# Patient Record
Sex: Male | Born: 1935 | Race: White | Hispanic: No | Smoking: Current every day smoker
Health system: Southern US, Community
[De-identification: ages and names within clinical notes are randomized; demographics above are authoritative.]

## PROBLEM LIST (undated history)

## (undated) DIAGNOSIS — L909 Atrophic disorder of skin, unspecified: Secondary | ICD-10-CM

## (undated) DIAGNOSIS — E119 Type 2 diabetes mellitus without complications: Secondary | ICD-10-CM

## (undated) DIAGNOSIS — I10 Essential (primary) hypertension: Secondary | ICD-10-CM

## (undated) DIAGNOSIS — I8310 Varicose veins of unspecified lower extremity with inflammation: Secondary | ICD-10-CM

## (undated) DIAGNOSIS — R300 Dysuria: Secondary | ICD-10-CM

## (undated) DIAGNOSIS — R634 Abnormal weight loss: Secondary | ICD-10-CM

## (undated) DIAGNOSIS — N281 Cyst of kidney, acquired: Secondary | ICD-10-CM

## (undated) DIAGNOSIS — H9190 Unspecified hearing loss, unspecified ear: Secondary | ICD-10-CM

## (undated) DIAGNOSIS — R609 Edema, unspecified: Secondary | ICD-10-CM

## (undated) DIAGNOSIS — E785 Hyperlipidemia, unspecified: Secondary | ICD-10-CM

## (undated) DIAGNOSIS — F17200 Nicotine dependence, unspecified, uncomplicated: Secondary | ICD-10-CM

## (undated) DIAGNOSIS — N529 Male erectile dysfunction, unspecified: Secondary | ICD-10-CM

## (undated) DIAGNOSIS — L919 Hypertrophic disorder of the skin, unspecified: Secondary | ICD-10-CM

## (undated) DIAGNOSIS — I4949 Other premature depolarization: Secondary | ICD-10-CM

## (undated) DIAGNOSIS — K4090 Unilateral inguinal hernia, without obstruction or gangrene, not specified as recurrent: Secondary | ICD-10-CM

## (undated) DIAGNOSIS — H269 Unspecified cataract: Secondary | ICD-10-CM

## (undated) DIAGNOSIS — N401 Enlarged prostate with lower urinary tract symptoms: Secondary | ICD-10-CM

## (undated) DIAGNOSIS — E559 Vitamin D deficiency, unspecified: Secondary | ICD-10-CM

## (undated) DIAGNOSIS — L821 Other seborrheic keratosis: Secondary | ICD-10-CM

## (undated) DIAGNOSIS — K5730 Diverticulosis of large intestine without perforation or abscess without bleeding: Secondary | ICD-10-CM

## (undated) HISTORY — DX: Unspecified hearing loss, unspecified ear: H91.90

## (undated) HISTORY — DX: Varicose veins of unspecified lower extremity with inflammation: I83.10

## (undated) HISTORY — DX: Abnormal weight loss: R63.4

## (undated) HISTORY — DX: Benign prostatic hyperplasia with lower urinary tract symptoms: N40.1

## (undated) HISTORY — DX: Atrophic disorder of skin, unspecified: L90.9

## (undated) HISTORY — DX: Edema, unspecified: R60.9

## (undated) HISTORY — DX: Essential (primary) hypertension: I10

## (undated) HISTORY — DX: Cyst of kidney, acquired: N28.1

## (undated) HISTORY — DX: Hypertrophic disorder of the skin, unspecified: L91.9

## (undated) HISTORY — DX: Vitamin D deficiency, unspecified: E55.9

## (undated) HISTORY — DX: Hyperlipidemia, unspecified: E78.5

## (undated) HISTORY — DX: Diverticulosis of large intestine without perforation or abscess without bleeding: K57.30

## (undated) HISTORY — DX: Male erectile dysfunction, unspecified: N52.9

## (undated) HISTORY — DX: Other seborrheic keratosis: L82.1

## (undated) HISTORY — DX: Dysuria: R30.0

## (undated) HISTORY — DX: Unilateral inguinal hernia, without obstruction or gangrene, not specified as recurrent: K40.90

## (undated) HISTORY — DX: Type 2 diabetes mellitus without complications: E11.9

## (undated) HISTORY — DX: Other premature depolarization: I49.49

## (undated) HISTORY — DX: Unspecified cataract: H26.9

## (undated) HISTORY — DX: Nicotine dependence, unspecified, uncomplicated: F17.200

---
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
## 1992-03-12 HISTORY — PX: DUPUYTREN CONTRACTURE RELEASE: SHX1478

## 1998-03-12 DIAGNOSIS — H269 Unspecified cataract: Secondary | ICD-10-CM

## 1998-03-12 HISTORY — DX: Unspecified cataract: H26.9

## 1998-03-12 HISTORY — PX: EYE SURGERY: SHX253

## 2000-08-20 ENCOUNTER — Encounter: Payer: Self-pay | Admitting: *Deleted

## 2000-08-20 ENCOUNTER — Encounter: Admission: RE | Admit: 2000-08-20 | Discharge: 2000-08-20 | Payer: Self-pay | Admitting: *Deleted

## 2000-08-21 ENCOUNTER — Ambulatory Visit (HOSPITAL_BASED_OUTPATIENT_CLINIC_OR_DEPARTMENT_OTHER): Admission: RE | Admit: 2000-08-21 | Discharge: 2000-08-21 | Payer: Self-pay | Admitting: *Deleted

## 2002-08-03 ENCOUNTER — Encounter: Admission: RE | Admit: 2002-08-03 | Discharge: 2002-08-03 | Payer: Self-pay | Admitting: Unknown Physician Specialty

## 2003-03-13 DIAGNOSIS — N281 Cyst of kidney, acquired: Secondary | ICD-10-CM

## 2003-03-13 HISTORY — DX: Cyst of kidney, acquired: N28.1

## 2003-03-13 HISTORY — PX: HERNIA REPAIR: SHX51

## 2003-03-13 HISTORY — PX: CATARACT EXTRACTION W/ INTRAOCULAR LENS IMPLANT: SHX1309

## 2003-04-07 LAB — HM COLONOSCOPY: HM Colonoscopy: NORMAL

## 2003-05-28 DIAGNOSIS — K4090 Unilateral inguinal hernia, without obstruction or gangrene, not specified as recurrent: Secondary | ICD-10-CM

## 2003-05-28 HISTORY — DX: Unilateral inguinal hernia, without obstruction or gangrene, not specified as recurrent: K40.90

## 2003-07-16 ENCOUNTER — Encounter: Admission: RE | Admit: 2003-07-16 | Discharge: 2003-07-16 | Payer: Self-pay | Admitting: Surgery

## 2003-07-19 ENCOUNTER — Ambulatory Visit (HOSPITAL_COMMUNITY): Admission: RE | Admit: 2003-07-19 | Discharge: 2003-07-19 | Payer: Self-pay | Admitting: Surgery

## 2003-07-19 ENCOUNTER — Ambulatory Visit (HOSPITAL_BASED_OUTPATIENT_CLINIC_OR_DEPARTMENT_OTHER): Admission: RE | Admit: 2003-07-19 | Discharge: 2003-07-19 | Payer: Self-pay | Admitting: Surgery

## 2005-02-16 DIAGNOSIS — N529 Male erectile dysfunction, unspecified: Secondary | ICD-10-CM

## 2005-02-16 DIAGNOSIS — I10 Essential (primary) hypertension: Secondary | ICD-10-CM

## 2005-02-16 HISTORY — DX: Essential (primary) hypertension: I10

## 2005-02-16 HISTORY — DX: Male erectile dysfunction, unspecified: N52.9

## 2005-02-28 DIAGNOSIS — I4949 Other premature depolarization: Secondary | ICD-10-CM | POA: Insufficient documentation

## 2005-02-28 DIAGNOSIS — K5730 Diverticulosis of large intestine without perforation or abscess without bleeding: Secondary | ICD-10-CM

## 2005-02-28 HISTORY — DX: Other premature depolarization: I49.49

## 2005-02-28 HISTORY — DX: Diverticulosis of large intestine without perforation or abscess without bleeding: K57.30

## 2005-12-19 DIAGNOSIS — F17200 Nicotine dependence, unspecified, uncomplicated: Secondary | ICD-10-CM

## 2005-12-19 HISTORY — DX: Nicotine dependence, unspecified, uncomplicated: F17.200

## 2006-12-04 DIAGNOSIS — E785 Hyperlipidemia, unspecified: Secondary | ICD-10-CM

## 2006-12-04 DIAGNOSIS — E119 Type 2 diabetes mellitus without complications: Secondary | ICD-10-CM

## 2006-12-04 DIAGNOSIS — R609 Edema, unspecified: Secondary | ICD-10-CM

## 2006-12-04 DIAGNOSIS — I8310 Varicose veins of unspecified lower extremity with inflammation: Secondary | ICD-10-CM

## 2006-12-04 HISTORY — DX: Hyperlipidemia, unspecified: E78.5

## 2006-12-04 HISTORY — DX: Varicose veins of unspecified lower extremity with inflammation: I83.10

## 2006-12-04 HISTORY — DX: Edema, unspecified: R60.9

## 2006-12-04 HISTORY — DX: Type 2 diabetes mellitus without complications: E11.9

## 2007-08-08 DIAGNOSIS — N401 Enlarged prostate with lower urinary tract symptoms: Secondary | ICD-10-CM

## 2007-08-08 DIAGNOSIS — L821 Other seborrheic keratosis: Secondary | ICD-10-CM

## 2007-08-08 DIAGNOSIS — N138 Other obstructive and reflux uropathy: Secondary | ICD-10-CM

## 2007-08-08 HISTORY — DX: Other obstructive and reflux uropathy: N13.8

## 2007-08-08 HISTORY — DX: Other seborrheic keratosis: L82.1

## 2007-08-08 HISTORY — DX: Benign prostatic hyperplasia with lower urinary tract symptoms: N40.1

## 2008-08-04 DIAGNOSIS — E559 Vitamin D deficiency, unspecified: Secondary | ICD-10-CM

## 2008-08-04 HISTORY — DX: Vitamin D deficiency, unspecified: E55.9

## 2012-03-28 DIAGNOSIS — R300 Dysuria: Secondary | ICD-10-CM

## 2012-03-28 HISTORY — DX: Dysuria: R30.0

## 2012-05-09 ENCOUNTER — Ambulatory Visit: Payer: Medicare Other

## 2012-05-09 ENCOUNTER — Ambulatory Visit (INDEPENDENT_AMBULATORY_CARE_PROVIDER_SITE_OTHER): Payer: Medicare Other | Admitting: Family Medicine

## 2012-05-09 VITALS — BP 136/82 | HR 56 | Temp 98.3°F | Resp 17 | Ht 67.0 in | Wt 158.0 lb

## 2012-05-09 DIAGNOSIS — I10 Essential (primary) hypertension: Secondary | ICD-10-CM

## 2012-05-09 DIAGNOSIS — M25522 Pain in left elbow: Secondary | ICD-10-CM

## 2012-05-09 DIAGNOSIS — R350 Frequency of micturition: Secondary | ICD-10-CM

## 2012-05-09 DIAGNOSIS — M25529 Pain in unspecified elbow: Secondary | ICD-10-CM

## 2012-05-09 DIAGNOSIS — T148XXA Other injury of unspecified body region, initial encounter: Secondary | ICD-10-CM

## 2012-05-09 LAB — POCT CBC
Granulocyte percent: 68.8 %G (ref 37–80)
HCT, POC: 46 % (ref 43.5–53.7)
Hemoglobin: 14.2 g/dL (ref 14.1–18.1)
Lymph, poc: 1.6 (ref 0.6–3.4)
MCH, POC: 28.9 pg (ref 27–31.2)
MCHC: 30.9 g/dL — AB (ref 31.8–35.4)
MCV: 93.4 fL (ref 80–97)
MID (cbc): 0.6 (ref 0–0.9)
MPV: 9.8 fL (ref 0–99.8)
POC Granulocyte: 4.9 (ref 2–6.9)
POC LYMPH PERCENT: 22.2 %L (ref 10–50)
POC MID %: 9 % (ref 0–12)
Platelet Count, POC: 241 10*3/uL (ref 142–424)
RBC: 4.92 M/uL (ref 4.69–6.13)
RDW, POC: 15.5 %
WBC: 7.1 10*3/uL (ref 4.6–10.2)

## 2012-05-09 LAB — COMPREHENSIVE METABOLIC PANEL
Albumin: 4.2 g/dL (ref 3.5–5.2)
Alkaline Phosphatase: 69 U/L (ref 39–117)
BUN: 27 mg/dL — ABNORMAL HIGH (ref 6–23)
CO2: 30 mEq/L (ref 19–32)
Calcium: 9.2 mg/dL (ref 8.4–10.5)
Glucose, Bld: 79 mg/dL (ref 70–99)
Potassium: 4.2 mEq/L (ref 3.5–5.3)

## 2012-05-09 LAB — POCT UA - MICROSCOPIC ONLY
Bacteria, U Microscopic: NEGATIVE
Casts, Ur, LPF, POC: NEGATIVE
Crystals, Ur, HPF, POC: NEGATIVE
Mucus, UA: NEGATIVE
RBC, urine, microscopic: NEGATIVE
WBC, Ur, HPF, POC: NEGATIVE
Yeast, UA: NEGATIVE

## 2012-05-09 LAB — COMPREHENSIVE METABOLIC PANEL WITH GFR
ALT: 9 U/L (ref 0–53)
AST: 13 U/L (ref 0–37)
Chloride: 106 meq/L (ref 96–112)
Creat: 0.92 mg/dL (ref 0.50–1.35)
Sodium: 144 meq/L (ref 135–145)
Total Bilirubin: 0.8 mg/dL (ref 0.3–1.2)
Total Protein: 6.9 g/dL (ref 6.0–8.3)

## 2012-05-09 LAB — POCT URINALYSIS DIPSTICK
Blood, UA: NEGATIVE
Glucose, UA: NEGATIVE
Leukocytes, UA: NEGATIVE
Nitrite, UA: NEGATIVE
Protein, UA: NEGATIVE
Spec Grav, UA: 1.025
Urobilinogen, UA: 4
pH, UA: 6

## 2012-05-09 NOTE — Progress Notes (Signed)
 [facility]:  Office Visit  Chief Complaint:  Chief Complaint  Patient presents with  . Arm Injury    fall     HPI: Victor Buck is a 79 y.o. male who complains of left arm pain after he  Fell at home on carpet in his den  and landed on left arm 3 days ago. He is having  pain and trouble gripping. + swelling n left forearm. He was at home, in the den, got up out of recliner while  watching TV and fell straight down. His legs gave way and landed flat on his face. He denies LOC, confusion, nose bleeds. Per his daughter she states that he has an issue with falls, but this time he stood up and just fell so that was a little different. Deneis CP, palpitations, SOB, urinary issues. Recent exam with PCP Art greene was WNL for age. He is on a baby aspirin, and 2 HTN meds.   Past Medical History  Diagnosis Date  . Hypertension    Past Surgical History  Procedure Laterality Date  . Appendectomy    . Hernia repair     History   Social History  . Marital Status: Widowed    Spouse Name: N/A    Number of Children: N/A  . Years of Education: N/A   Social History Main Topics  . Smoking status: Current Every Day Smoker -- 0.50 packs/day for 70 years    Types: Cigarettes  . Smokeless tobacco: None  . Alcohol Use: 0.6 oz/week    1 Glasses of wine per week  . Drug Use: No  . Sexually Active: No   Other Topics Concern  . None   Social History Narrative  . None   History reviewed. No pertinent family history. No Known Allergies Prior to Admission medications   Medication Sig Start Date End Date Taking? Authorizing Provider  aspirin 81 MG tablet Take 81 mg by mouth daily.   Yes Historical Provider, MD  enalapril (VASOTEC) 20 MG tablet Take 20 mg by mouth daily.   Yes Historical Provider, MD  metoprolol succinate (TOPROL-XL) 25 MG 24 hr tablet Take 25 mg by mouth daily.   Yes Historical Provider, MD     ROS: The patient denies fevers, chills, night sweats,  unintentional weight loss, chest pain, palpitations, wheezing, dyspnea on exertion, nausea, vomiting, abdominal pain, dysuria, hematuria, melena, numbness, weakness, or tingling.   All other systems have been reviewed and were otherwise negative with the exception of those mentioned in the HPI and as above.    PHYSICAL EXAM: Filed Vitals:   05/09/12 1119  BP: 136/82  Pulse: 56  Temp: 98.3 F (36.8 C)  Resp: 17   Filed Vitals:   05/09/12 1119  Height: 5\' 7"  (1.702 m)  Weight: 158 lb (71.668 kg)   Body mass index is 24.74 kg/(m^2).  General: Alert, no acute distress HEENT:  Normocephalic, atraumatic, oropharynx patent.  EOMI, PERRLA, head eam nl. Fundoscopic exam grossly nl Cardiovascular:  Regular rate and rhythm, no rubs murmurs or gallops.  No Carotid bruits, radial pulse intact. No pedal edema.  Respiratory: Clear to auscultation bilaterally.  No wheezes, rales, or rhonchi.  No cyanosis, no use of accessory musculature GI: No organomegaly, abdomen is soft and non-tender, positive bowel sounds.  No masses. Skin: No rashes. Neurologic: Facial musculature symmetric. Psychiatric: Patient is appropriate throughout our interaction. Lymphatic: No cervical lymphadenopathy Musculoskeletal: Gait intact with cane, severe kyphosis Left shoulder-nl exam  for age Left elbow-full ROM, tender Left forarm-no defomrities, + swelling, tender on mid forearm, able to pronate and supinate without pain, 5/5 strength, sensation intact Left wrist/hand-full ROM, 5/5 stregnth, sensation intact   LABS: Results for orders placed in visit on 05/09/12  POCT UA - MICROSCOPIC ONLY      Result Value Range   WBC, Ur, HPF, POC neg     RBC, urine, microscopic neg     Bacteria, U Microscopic neg     Mucus, UA neg     Epithelial cells, urine per micros 0-1     Crystals, Ur, HPF, POC neg     Casts, Ur, LPF, POC neg     Yeast, UA neg    POCT URINALYSIS DIPSTICK      Result Value Range   Color, UA amber      Clarity, UA clear     Glucose, UA neg     Bilirubin, UA small     Ketones, UA trace     Spec Grav, UA 1.025     Blood, UA neg     pH, UA 6.0     Protein, UA neg     Urobilinogen, UA 4.0     Nitrite, UA neg     Leukocytes, UA Negative    POCT CBC      Result Value Range   WBC 7.1  4.6 - 10.2 K/uL   Lymph, poc 1.6  0.6 - 3.4   POC LYMPH PERCENT 22.2  10 - 50 %L   MID (cbc) 0.6  0 - 0.9   POC MID % 9.0  0 - 12 %M   POC Granulocyte 4.9  2 - 6.9   Granulocyte percent 68.8  37 - 80 %G   RBC 4.92  4.69 - 6.13 M/uL   Hemoglobin 14.2  14.1 - 18.1 g/dL   HCT, POC 45.4  09.8 - 53.7 %   MCV 93.4  80 - 97 fL   MCH, POC 28.9  27 - 31.2 pg   MCHC 30.9 (*) 31.8 - 35.4 g/dL   RDW, POC 11.9     Platelet Count, POC 241  142 - 424 K/uL   MPV 9.8  0 - 99.8 fL     EKG/XRAY:   Primary read interpreted by [facility] Conley [facility]s at [facility]. Elbow-no fx/dislocation Forearm-no fx/dislocation, ? lucency on  Distal ulna Wrist-no fracture/dislocation    ASSESSMENT/PLAN: Encounter Diagnoses  Name Primary?  . Pain in joint, upper arm, left Yes  . Increased urinary frequency   . HTN (hypertension)    No fractures on xray Patient has msk contusions/bone contusions from fall Rx wrist splint, take otc tyleonol, motrin prn Monitor BP, pulse  Since on metoprolol and he is complaining of being dizzy, ( chronic issue), Orthostatic was normal Advise to monitor pulse since he has dipped in the mid 40s x 1 His urine showed trace ketones, encourage to increase fluid intake  F/u prn    ,  PHUONG, DO 05/09/2012 12:47 PM

## 2012-05-10 ENCOUNTER — Telehealth: Payer: Self-pay | Admitting: Family Medicine

## 2012-05-10 NOTE — Telephone Encounter (Signed)
Spoke with patient regarding labs, and also xrays

## 2012-05-13 ENCOUNTER — Telehealth: Payer: Self-pay

## 2012-05-13 NOTE — Telephone Encounter (Signed)
Patient has increased swelling of hand/fingers I have given her instructions for him to try to move his fingers/ squeeze a ball, sponge. She also states there is a 30 point difference in his blood pressure in his left arm vs his right. Please advise on swelling/ seems like it should be better by now.

## 2012-05-13 NOTE — Telephone Encounter (Signed)
Called no answer

## 2012-05-13 NOTE — Telephone Encounter (Signed)
Patient's daughter would like call back regarding her dad who's hand has become very swollen since he saw Dr Conley Rolls last week.  Best [phone]

## 2012-05-14 ENCOUNTER — Telehealth: Payer: Self-pay | Admitting: Family Medicine

## 2012-05-14 NOTE — Telephone Encounter (Signed)
Called and LM regarding BP, hand swelling.

## 2012-05-18 ENCOUNTER — Ambulatory Visit (INDEPENDENT_AMBULATORY_CARE_PROVIDER_SITE_OTHER): Payer: Medicare Other | Admitting: Internal Medicine

## 2012-05-18 ENCOUNTER — Ambulatory Visit: Payer: Medicare Other

## 2012-05-18 VITALS — BP 140/80 | HR 80 | Temp 98.5°F | Resp 16 | Ht 67.0 in | Wt 159.6 lb

## 2012-05-18 DIAGNOSIS — I10 Essential (primary) hypertension: Secondary | ICD-10-CM

## 2012-05-18 DIAGNOSIS — M25532 Pain in left wrist: Secondary | ICD-10-CM

## 2012-05-18 DIAGNOSIS — S52532A Colles' fracture of left radius, initial encounter for closed fracture: Secondary | ICD-10-CM

## 2012-05-18 NOTE — Progress Notes (Signed)
  Subjective:    Patient ID: Victor Buck, male    DOB: March 07, 1920, 79 y.o.   MRN: [id number]  HPI Pt here for a recheck of left arm from 05/09/12 OV.  His arm is still swollen and his thumb is painful. Doesn't feel like it's much better. Can't make a fist No numbness  Review of Systems     Objective:   Physical Exam Left hand and forearm is swollen Markedly tender over the distal radius Snuff box intact Lacerations over fifth MCP are healing Ecchymoses over the dorsum of the hand Mildly tender midcarpal but good range of motion except pain on wrist extension   [facility] reading (PRIMARY) by  Dr. Merla Riches. Fracture with mild angulation and displacement of the distal radius  In retrospect there were suggestions of the beginnings of this on his initial films in one view  Carpals appear stable      Assessment & Plan:

## 2012-05-21 ENCOUNTER — Telehealth: Payer: Self-pay | Admitting: Radiology

## 2012-05-21 NOTE — Telephone Encounter (Signed)
Dr Merlyn Lot office has been without power this week. Has patient seen him?

## 2012-06-04 ENCOUNTER — Ambulatory Visit (INDEPENDENT_AMBULATORY_CARE_PROVIDER_SITE_OTHER): Payer: Medicare Other | Admitting: Internal Medicine

## 2012-06-04 VITALS — BP 124/68 | HR 60 | Temp 97.0°F | Resp 20 | Ht 66.5 in | Wt 157.6 lb

## 2012-06-04 DIAGNOSIS — S52532A Colles' fracture of left radius, initial encounter for closed fracture: Secondary | ICD-10-CM | POA: Insufficient documentation

## 2012-06-04 DIAGNOSIS — S5290XD Unspecified fracture of unspecified forearm, subsequent encounter for closed fracture with routine healing: Secondary | ICD-10-CM

## 2012-06-04 DIAGNOSIS — S52532D Colles' fracture of left radius, subsequent encounter for closed fracture with routine healing: Secondary | ICD-10-CM

## 2012-06-04 DIAGNOSIS — I10 Essential (primary) hypertension: Secondary | ICD-10-CM

## 2012-06-04 MED ORDER — ENALAPRIL MALEATE 20 MG PO TABS: ORAL_TABLET | ORAL | Status: DC

## 2012-06-04 NOTE — Assessment & Plan Note (Signed)
Try reduction in BP meds.

## 2012-06-04 NOTE — Patient Instructions (Addendum)
Try to keep the left wrist higher than you heart level. See Dr. Mina Marble as planned, 06/10/12. Discussed possible medical workup with patient and daughter. Potential tests could include CT or MRI brain, Carotid Doppler, 2D echo, Holter, or EEG. I do not not think any of these tests are necessary at present, Pt and daughter express desire to simply "watch" for further problems. Symptom of syncope, palpitations, fever, Chest pain, or severe dyspnea are to be reported immediately.

## 2012-06-05 ENCOUNTER — Encounter: Payer: Self-pay | Admitting: Internal Medicine

## 2012-06-05 NOTE — Progress Notes (Signed)
Subjective:    Patient ID: Victor Buck, male    DOB: [birth year], 79 y.o.   MRN: [id number]  HPI Patient has fallen twice in the last three weeks.  Larey Seat ou of a chair and hurt the left wrist with a Colle's fx.. Seen at [facility]. Fractured his wrist. Saw Dr. Merlyn Lot. Has short arm Velcro closure splint left arm. Little discomfort, but has had significant swelling.. It is improving Did not lose consciousness with either fall. Daughter is with the patient. She worries there may be some medical reason behind the falls.  He has no prior hx of TIA, CVA,  Or cardiac arrhythmia. He has not had seizures.   Review of Systems  Constitutional: Negative for fever, chills, weight loss, malaise/fatigue and diaphoresis.  HENT: Positive for hearing loss. Negative for ear pain, congestion, neck pain, tinnitus and ear discharge.   Eyes: Negative for blurred vision, double vision, photophobia, pain, discharge and redness.  Respiratory: Negative for cough, hemoptysis, sputum production and shortness of breath.   Cardiovascular: Negative for chest pain, palpitations, orthopnea, claudication, leg swelling and PND.  Gastrointestinal: Negative for heartburn, nausea, vomiting, abdominal pain, diarrhea and constipation.  Endocrine: Negative for polydipsia.  Genitourinary: Negative for dysuria, urgency and frequency.  Musculoskeletal: Positive for falls. Negative for myalgias and back pain.  Skin: Negative for itching and rash.  Allergic/Immunologic: Negative for environmental allergies.  Neurological: Negative for dizziness, tingling, tremors, sensory change, speech change, focal weakness, seizures, weakness and headaches.  Hematological: Does not bruise/bleed easily.  Psychiatric/Behavioral: Negative for depression, hallucinations, memory loss and substance abuse. The patient is not nervous/anxious and does not have insomnia.       Review of Systems  Constitutional: Negative for fever, chills,  weight loss, malaise/fatigue and diaphoresis.  HENT: Positive for hearing loss. Negative for ear pain, congestion, neck pain, tinnitus and ear discharge.   Eyes: Negative for blurred vision, double vision, photophobia, pain, discharge and redness.  Respiratory: Negative for cough, hemoptysis, sputum production and shortness of breath.   Cardiovascular: Negative for chest pain, palpitations, orthopnea, claudication, leg swelling and PND.  Gastrointestinal: Negative for heartburn, nausea, vomiting, abdominal pain, diarrhea and constipation.  Genitourinary: Negative for dysuria, urgency and frequency.  Musculoskeletal: Positive for falls. Negative for myalgias and back pain.  Skin: Negative for itching and rash.  Neurological: Negative for dizziness, tingling, tremors, sensory change, speech change, focal weakness, seizures, weakness and headaches.  Endo/Heme/Allergies: Negative for environmental allergies and polydipsia. Does not bruise/bleed easily.  Psychiatric/Behavioral: Negative for depression, hallucinations, memory loss and substance abuse. The patient is not nervous/anxious and does not have insomnia.     Objective:   Physical Exam  Constitutional: He is oriented to person, place, and time. No distress.  Frail  HENT:  Head: Atraumatic.  Loss of hearing bilaterally  Neck: Neck supple. No JVD present. No tracheal deviation present. No thyromegaly present.  Cardiovascular: Normal rate, regular rhythm, normal heart sounds and intact distal pulses.  Exam reveals no gallop and no friction rub.   No murmur heard. Pulmonary/Chest: Effort normal and breath sounds normal. No respiratory distress. He has no wheezes. He has no rales.  Abdominal: Bowel sounds are normal. He exhibits no distension. There is no tenderness. There is no rebound.  Musculoskeletal: Normal range of motion. He exhibits no edema and no tenderness.  Lymphadenopathy:    He has no cervical adenopathy.  Neurological: He is  alert and oriented to person, place, and time. He has normal reflexes. No  cranial nerve deficit. Coordination normal.  Skin: Skin is warm and dry. No rash noted. He is not diaphoretic. No erythema. No pallor.  Psychiatric: He has a normal mood and affect. His behavior is normal. Judgment and thought content normal.          Assessment & Plan:  Discussed possible medical workup with patient and daughter. Potential tests could include CT or MRI brain, Carotid Doppler, 2D echo, Holter, or EEG. I do not not think any of these tests are necessary at present, Pt and daughter express desire to simply "watch" for further problems. Symptom of syncope, palpitations, fever, Chest pain, or severe dyspnea are to be reported immediately.

## 2012-06-09 ENCOUNTER — Encounter: Payer: Self-pay | Admitting: Internal Medicine

## 2012-06-10 ENCOUNTER — Telehealth: Payer: Self-pay | Admitting: *Deleted

## 2012-06-10 NOTE — Telephone Encounter (Signed)
Victor Buck daughter called and wanted to know what medication Dr Chilton Si told her to cut in half I told her it was the  Enalapril. She thank me and hung up. She also asked about the portal coming into the office. I told her for now if she could please call the office

## 2012-07-09 ENCOUNTER — Encounter: Payer: Self-pay | Admitting: Internal Medicine

## 2012-07-09 ENCOUNTER — Encounter: Payer: Self-pay | Admitting: Geriatric Medicine

## 2012-07-09 ENCOUNTER — Ambulatory Visit (INDEPENDENT_AMBULATORY_CARE_PROVIDER_SITE_OTHER): Payer: Medicare Other | Admitting: Internal Medicine

## 2012-07-09 VITALS — BP 136/84 | HR 56 | Temp 97.6°F | Resp 14 | Ht 67.0 in | Wt 159.0 lb

## 2012-07-09 DIAGNOSIS — E119 Type 2 diabetes mellitus without complications: Secondary | ICD-10-CM

## 2012-07-09 DIAGNOSIS — I10 Essential (primary) hypertension: Secondary | ICD-10-CM

## 2012-07-09 DIAGNOSIS — S5290XD Unspecified fracture of unspecified forearm, subsequent encounter for closed fracture with routine healing: Secondary | ICD-10-CM

## 2012-07-09 DIAGNOSIS — E785 Hyperlipidemia, unspecified: Secondary | ICD-10-CM

## 2012-07-09 DIAGNOSIS — R269 Unspecified abnormalities of gait and mobility: Secondary | ICD-10-CM

## 2012-07-09 DIAGNOSIS — M4000 Postural kyphosis, site unspecified: Secondary | ICD-10-CM

## 2012-07-09 DIAGNOSIS — S52532D Colles' fracture of left radius, subsequent encounter for closed fracture with routine healing: Secondary | ICD-10-CM

## 2012-07-09 NOTE — Progress Notes (Signed)
Subjective:    Patient ID: Victor Buck, male    DOB: 09/13/1919, 79 y.o.   MRN: [id number]  HPI Broke left arm in May 03, 2012. Seeing Dr. Mina Marble. Using splint.  Fell prior to fx arm. He can't recall how he fell.  Current Outpatient Prescriptions on File Prior to Visit  Medication Sig Dispense Refill  . aspirin 81 MG tablet Take 81 mg by mouth daily.      . enalapril (VASOTEC) 20 MG tablet I/2 tablet daily to control BP  30 tablet  1  . metoprolol succinate (TOPROL-XL) 25 MG 24 hr tablet Take 25 mg by mouth daily.           Review of Systems  Constitutional: Negative for fever, chills, weight loss, malaise/fatigue and diaphoresis.  HENT: Positive for hearing loss. Negative for ear pain, congestion, neck pain, tinnitus and ear discharge.   Eyes: Negative for blurred vision, double vision, photophobia, pain, discharge and redness.  Respiratory: Negative for cough, hemoptysis, sputum production and shortness of breath.   Cardiovascular: Negative for chest pain, palpitations, orthopnea, claudication, leg swelling and PND.  Gastrointestinal: Negative for heartburn, nausea, vomiting, abdominal pain, diarrhea and constipation.  Endocrine: Negative for polydipsia.  Genitourinary: Negative for dysuria, urgency and frequency.  Musculoskeletal: Positive for falls. Negative for myalgias and back pain.  Skin: Negative for itching and rash.  Allergic/Immunologic: Negative for environmental allergies.  Neurological: Negative for dizziness, tingling, tremors, sensory change, speech change, focal weakness, seizures, weakness and headaches.  Hematological: Does not bruise/bleed easily.  Psychiatric/Behavioral: Negative for depression, hallucinations, memory loss and substance abuse. The patient is not nervous/anxious and does not have insomnia.        Objective:   Physical Exam  Nursing note and vitals reviewed. Constitutional: He is oriented to person, place, and time. No distress.   Frail  HENT:  Head: Atraumatic.  Loss of hearing bilaterally  Eyes:  Left lens implant  Neck: Neck supple. No JVD present. No tracheal deviation present. No thyromegaly present.  Cardiovascular: Normal rate, regular rhythm, normal heart sounds and intact distal pulses.  Exam reveals no gallop and no friction rub.   No murmur heard. Pulmonary/Chest: Effort normal. No respiratory distress. He has wheezes. He has no rales.  Abdominal: Bowel sounds are normal. He exhibits no distension. There is no tenderness. There is no rebound.  Musculoskeletal: Normal range of motion. He exhibits edema. He exhibits no tenderness.  Severe kyphosis. Head pitched forward.  Lymphadenopathy:    He has no cervical adenopathy.  Neurological: He is alert and oriented to person, place, and time. He has normal reflexes. No cranial nerve deficit. Coordination normal.  Skin: Skin is warm and dry. No rash noted. He is not diaphoretic. No erythema. No pallor.  Psychiatric: He has a normal mood and affect. His behavior is normal. Judgment and thought content normal.      Office Visit on 05/09/2012  Component Date Value Range Status  . WBC, Ur, HPF, POC 05/09/2012 neg   Final  . RBC, urine, microscopic 05/09/2012 neg   Final  . Bacteria, U Microscopic 05/09/2012 neg   Final  . Mucus, UA 05/09/2012 neg   Final  . Epithelial cells, urine per micros 05/09/2012 0-1   Final  . Crystals, Ur, HPF, POC 05/09/2012 neg   Final  . Casts, Ur, LPF, POC 05/09/2012 neg   Final  . Yeast, UA 05/09/2012 neg   Final  . Color, UA 05/09/2012 amber   Final  .  Clarity, UA 05/09/2012 clear   Final  . Glucose, UA 05/09/2012 neg   Final  . Bilirubin, UA 05/09/2012 small   Final  . Ketones, UA 05/09/2012 trace   Final  . Spec Grav, UA 05/09/2012 1.025   Final  . Blood, UA 05/09/2012 neg   Final  . pH, UA 05/09/2012 6.0   Final  . Protein, UA 05/09/2012 neg   Final  . Urobilinogen, UA 05/09/2012 4.0   Final  . Nitrite, UA 05/09/2012  neg   Final  . Leukocytes, UA 05/09/2012 Negative   Final  . WBC 05/09/2012 7.1  4.6 - 10.2 K/uL Final  . Lymph, poc 05/09/2012 1.6  0.6 - 3.4 Final  . POC LYMPH PERCENT 05/09/2012 22.2  10 - 50 %L Final  . MID (cbc) 05/09/2012 0.6  0 - 0.9 Final  . POC MID % 05/09/2012 9.0  0 - 12 %M Final  . POC Granulocyte 05/09/2012 4.9  2 - 6.9 Final  . Granulocyte percent 05/09/2012 68.8  37 - 80 %G Final  . RBC 05/09/2012 4.92  4.69 - 6.13 M/uL Final  . Hemoglobin 05/09/2012 14.2  14.1 - 18.1 g/dL Final  . HCT, POC 16/12/9602 46.0  43.5 - 53.7 % Final  . MCV 05/09/2012 93.4  80 - 97 fL Final  . MCH, POC 05/09/2012 28.9  27 - 31.2 pg Final  . MCHC 05/09/2012 30.9* 31.8 - 35.4 g/dL Final  . RDW, POC 54/11/8117 15.5   Final  . Platelet Count, POC 05/09/2012 241  142 - 424 K/uL Final  . MPV 05/09/2012 9.8  0 - 99.8 fL Final  . Sodium 05/09/2012 144  135 - 145 mEq/L Final  . Potassium 05/09/2012 4.2  3.5 - 5.3 mEq/L Final  . Chloride 05/09/2012 106  96 - 112 mEq/L Final  . CO2 05/09/2012 30  19 - 32 mEq/L Final  . Glucose, Bld 05/09/2012 79  70 - 99 mg/dL Final  . BUN 14/78/2956 27* 6 - 23 mg/dL Final  . Creat 21/30/8657 0.92  0.50 - 1.35 mg/dL Final  . Total Bilirubin 05/09/2012 0.8  0.3 - 1.2 mg/dL Final  . Alkaline Phosphatase 05/09/2012 69  39 - 117 U/L Final  . AST 05/09/2012 13  0 - 37 U/L Final  . ALT 05/09/2012 9  0 - 53 U/L Final  . Total Protein 05/09/2012 6.9  6.0 - 8.3 g/dL Final  . Albumin 84/69/6295 4.2  3.5 - 5.2 g/dL Final  . Calcium 28/41/3244 9.2  8.4 - 10.5 mg/dL Final      Assessment & Plan:  Abnormality of gait : use cane or walker   Plan: Ambulatory referral to Physical Therapy  HTN (hypertension): controlled  Colles' fracture of left radius, closed, with routine healing, subsequent encounter: continue to see orhto  Other and unspecified hyperlipidemia: needs lab  Type II or unspecified type diabetes mellitus without mention of complication, not stated as  uncontrolled: needs followup lab  Kyphosis (acquired) (postural): observe

## 2012-07-09 NOTE — Patient Instructions (Signed)
We will schedule outpatient Physical Therapy for strengthening and balance. Continue current medications.

## 2012-07-20 ENCOUNTER — Emergency Department (HOSPITAL_COMMUNITY): Payer: Medicare Other

## 2012-07-20 ENCOUNTER — Emergency Department (HOSPITAL_COMMUNITY)
Admission: EM | Admit: 2012-07-20 | Discharge: 2012-07-20 | Disposition: A | Discharge status: Home / Self Care | Payer: Medicare Other | Attending: Emergency Medicine | Admitting: Emergency Medicine

## 2012-07-20 DIAGNOSIS — Z862 Personal history of diseases of the blood and blood-forming organs and certain disorders involving the immune mechanism: Secondary | ICD-10-CM | POA: Insufficient documentation

## 2012-07-20 DIAGNOSIS — Z8679 Personal history of other diseases of the circulatory system: Secondary | ICD-10-CM | POA: Insufficient documentation

## 2012-07-20 DIAGNOSIS — S41109A Unspecified open wound of unspecified upper arm, initial encounter: Secondary | ICD-10-CM | POA: Insufficient documentation

## 2012-07-20 DIAGNOSIS — S41112A Laceration without foreign body of left upper arm, initial encounter: Secondary | ICD-10-CM

## 2012-07-20 DIAGNOSIS — Z8719 Personal history of other diseases of the digestive system: Secondary | ICD-10-CM | POA: Insufficient documentation

## 2012-07-20 DIAGNOSIS — Z87448 Personal history of other diseases of urinary system: Secondary | ICD-10-CM | POA: Insufficient documentation

## 2012-07-20 DIAGNOSIS — Y9389 Activity, other specified: Secondary | ICD-10-CM | POA: Insufficient documentation

## 2012-07-20 DIAGNOSIS — Z9181 History of falling: Secondary | ICD-10-CM | POA: Insufficient documentation

## 2012-07-20 DIAGNOSIS — W010XXA Fall on same level from slipping, tripping and stumbling without subsequent striking against object, initial encounter: Secondary | ICD-10-CM | POA: Insufficient documentation

## 2012-07-20 DIAGNOSIS — E119 Type 2 diabetes mellitus without complications: Secondary | ICD-10-CM | POA: Insufficient documentation

## 2012-07-20 DIAGNOSIS — F17200 Nicotine dependence, unspecified, uncomplicated: Secondary | ICD-10-CM | POA: Insufficient documentation

## 2012-07-20 DIAGNOSIS — Y929 Unspecified place or not applicable: Secondary | ICD-10-CM | POA: Insufficient documentation

## 2012-07-20 DIAGNOSIS — W19XXXA Unspecified fall, initial encounter: Secondary | ICD-10-CM

## 2012-07-20 DIAGNOSIS — E785 Hyperlipidemia, unspecified: Secondary | ICD-10-CM | POA: Insufficient documentation

## 2012-07-20 DIAGNOSIS — Z8669 Personal history of other diseases of the nervous system and sense organs: Secondary | ICD-10-CM | POA: Insufficient documentation

## 2012-07-20 DIAGNOSIS — I10 Essential (primary) hypertension: Secondary | ICD-10-CM | POA: Insufficient documentation

## 2012-07-20 DIAGNOSIS — Z79899 Other long term (current) drug therapy: Secondary | ICD-10-CM | POA: Insufficient documentation

## 2012-07-20 DIAGNOSIS — Z8639 Personal history of other endocrine, nutritional and metabolic disease: Secondary | ICD-10-CM | POA: Insufficient documentation

## 2012-07-20 DIAGNOSIS — Z7982 Long term (current) use of aspirin: Secondary | ICD-10-CM | POA: Insufficient documentation

## 2012-07-20 DIAGNOSIS — Z872 Personal history of diseases of the skin and subcutaneous tissue: Secondary | ICD-10-CM | POA: Insufficient documentation

## 2012-07-20 LAB — BASIC METABOLIC PANEL
BUN: 26 mg/dL — ABNORMAL HIGH (ref 6–23)
Calcium: 9.2 mg/dL (ref 8.4–10.5)
GFR calc Af Amer: 85 mL/min — ABNORMAL LOW (ref >90)
GFR calc non Af Amer: 73 mL/min — ABNORMAL LOW (ref >90)
Potassium: 3.8 mEq/L (ref 3.5–5.1)
Sodium: 138 mEq/L (ref 135–145)

## 2012-07-20 LAB — CBC
HCT: 43.1 % (ref 39.0–52.0)
MCH: 29 pg (ref 26.0–34.0)
MCHC: 32.7 g/dL (ref 30.0–36.0)
RDW: 14 % (ref 11.5–15.5)

## 2012-07-20 NOTE — ED Provider Notes (Signed)
History     CSN: [id number]  Arrival date & time 07/20/12  1820   First MD Initiated Contact with Patient 07/20/12 1834      No chief complaint on file.   (Consider location/radiation/quality/duration/timing/severity/associated sxs/prior treatment) HPI Comments: 79 y/o male with a history of 3 falls since January of this year presents to the ED with his daughter after a mechanical fall when he tripped and fell down 3 brick steps landing on his left side 1 hour prior to arrival. Daughter witnessed the fall and states she was no more than 10 feet away from him. He fell backwards and to avoid hitting his head he turned to the left and scraped his left upper arm. Denies feeling lightheaded or dizzy prior to falling and denies hitting his head. Denies any pain at this time. Daughter is concerned due to prior fall 3 months ago when he broke his left forearm and did not complain of symptoms until 1 week after the fall. Takes 81 mg aspirin daily. Denies hip tenderness, abdominal pain, extremity weakness, numbness or tingling, headache, confusion, chest pain or sob.  The history is provided by the patient and a relative.    Past Medical History  Diagnosis Date  . Hypertension   . Type II or unspecified type diabetes mellitus without mention of complication, not stated as uncontrolled   . Unspecified vitamin D deficiency   . Hyperlipidemia   . Tobacco use disorder   . Supraventricular premature beats   . Other premature beats   . Varicose veins of lower extremities with inflammation   . Inguinal hernia without mention of obstruction or gangrene, unilateral or unspecified, (not specified as recurrent)   . Diverticulosis of colon (without mention of hemorrhage)   . Acquired cyst of kidney   . Hypertrophy of prostate with urinary obstruction and other lower urinary tract symptoms (LUTS)   . Impotence of organic origin   . Unspecified hypertrophic and atrophic condition of skin   . Actinic  keratosis   . Other seborrheic keratosis   . Edema   . Loss of weight   . Dysuria   . Hearing decreased   . Unspecified hypertrophic and atrophic condition of skin   . Dysuria   . Loss of weight   . Actinic keratosis   . Edema   . Tobacco use disorder   . Supraventricular premature beats   . Other premature beats   . Diverticulosis of colon (without mention of hemorrhage)   . Acquired cyst of kidney     Past Surgical History  Procedure Laterality Date  . Appendectomy    . Tonsillectomy  1947  . Dupuytren contracture release  1994    Dr. Metro Kung  . Eye surgery Left 2000    Dr. Emmit Pomfret  . Hernia repair Right 2005    Dr. Jamey Ripa  . Cataract extraction w/ intraocular lens implant Right 2005    Dr. Emmit Pomfret    Family History  Problem Relation Age of Onset  . AAA (abdominal aortic aneurysm) Mother   . Unexplained death Father   . Unexplained death Sister   . Unexplained death Brother   . Unexplained death Brother   . Unexplained death Brother   . Unexplained death Sister     History  Substance Use Topics  . Smoking status: Current Every Day Smoker -- 0.50 packs/day for 70 years    Types: Cigarettes  . Smokeless tobacco: Not on file  . Alcohol Use: 0.6 oz/week  1 Glasses of wine per week      Review of Systems  Constitutional: Negative for activity change.  HENT: Negative for neck pain and neck stiffness.   Gastrointestinal: Negative for abdominal pain.  Musculoskeletal: Negative for back pain and arthralgias.  Skin: Positive for wound.  Psychiatric/Behavioral: Negative for confusion.  All other systems reviewed and are negative.    Allergies  Avodart and Flomax  Home Medications   Current Outpatient Rx  Name  Route  Sig  Dispense  Refill  . aspirin 81 MG tablet   Oral   Take 81 mg by mouth daily.         . enalapril (VASOTEC) 20 MG tablet      I/2 tablet daily to control BP   30 tablet   1   . metoprolol succinate (TOPROL-XL) 25 MG 24 hr  tablet   Oral   Take 25 mg by mouth daily.           BP 159/63  Pulse 60  Resp 16  SpO2 92%  Physical Exam  Nursing note and vitals reviewed. Constitutional: He is oriented to person, place, and time. He appears well-developed and well-nourished. No distress.  HENT:  Head: Normocephalic and atraumatic.  Mouth/Throat: Oropharynx is clear and moist.  Eyes: Conjunctivae and EOM are normal. Pupils are equal, round, and reactive to light.  Neck: Normal range of motion. Neck supple.  Cardiovascular: Normal rate, regular rhythm, normal heart sounds and intact distal pulses.   Pulmonary/Chest: Effort normal and breath sounds normal. No respiratory distress.  Musculoskeletal: Normal range of motion. He exhibits no edema.  Full left shoulder, elbow and hand ROM. Unable to assess wrist as splint in place from prior fracture. No edema. No tenderness to palpation on any aspect of left upper extremity. Capillary refill < 3 seconds. Normal strength. Tenderness to palpation of lateral aspect of proximal femur and greater trochanter. Full left hip ROM.   Neurological: He is alert and oriented to person, place, and time. He has normal strength. No sensory deficit.  Strength UE/LE 5/5 and equal bilateral.  Skin: Skin is warm and dry. He is not diaphoretic.  7 inch superficial skin tear of left upper arm. No bleeding.   Psychiatric: He has a normal mood and affect. His behavior is normal.    ED Course  Procedures (including critical care time)  Labs Reviewed  CBC - Abnormal; Notable for the following:    Platelets 142 (*)    All other components within normal limits  BASIC METABOLIC PANEL - Abnormal; Notable for the following:    BUN 26 (*)    GFR calc non Af Amer 73 (*)    GFR calc Af Amer 85 (*)    All other components within normal limits  PROTIME-INR   Dg Elbow Complete Left  07/20/2012  *RADIOLOGY REPORT*  Clinical Data: Fall.  Left elbow pain.  LEFT ELBOW - COMPLETE 3+ VIEW   Comparison: None.  Findings: Triceps insertional enthesopathy.  Nonstandard projections submitted due to forearm fracture.  Osteopenia.  There is no large elbow effusion.  No fracture around the elbow.  The radial head and neck appear intact.  IMPRESSION: No acute osseous abnormality.  Nonstandard projections.   Original Report Authenticated By: Andreas Newport, M.D.    Dg Forearm Left  07/20/2012  *RADIOLOGY REPORT*  Clinical Data: Fall.  Previous left wrist fracture.  Pain in the forearm.  LEFT FOREARM - 2 VIEW  Comparison: 05/18/2012.  Findings:  There is a splint over the distal left forearm. Comminuted distal radial diaphysis fracture is present with ossified callus.  The alignment appears unchanged compared to prior exam of 05/18/2012.  IMPRESSION: No acute osseous abnormality.  Healing distal radial diaphysis fracture.   Original Report Authenticated By: Andreas Newport, M.D.    Dg Hip Complete Left  07/20/2012  *RADIOLOGY REPORT*  Clinical Data: Fall, left hip pain.  LEFT HIP - COMPLETE 2+ VIEW  Comparison: None.  Findings: Degenerative changes in the hips bilaterally with joint space narrowing. No acute bony abnormality.  Specifically, no fracture, subluxation, or dislocation.  Soft tissues are intact.  IMPRESSION: No acute bony abnormality.   Original Report Authenticated By: Charlett Nose, M.D.    Dg Humerus Left  07/20/2012  *RADIOLOGY REPORT*  Clinical Data: Fall, left humerus pain.  LEFT HUMERUS - 2+ VIEW  Comparison: None.  Findings: No acute bony abnormality.  Specifically, no fracture, subluxation, or dislocation.  Soft tissues are intact.  IMPRESSION: No acute bony abnormality.   Original Report Authenticated By: Charlett Nose, M.D.      1. Fall, initial encounter   2. Skin tear of upper arm without complication, left, initial encounter       MDM  79 y/o male with mechanical fall. Xray of left hip, forearm, humerus, elbow without any acute abnormality. Full ROM of all extremities. Good  strength, sensation intact. Intact distal pulses. Vitals stable, NAD. Superficial skin tear of left upper arm without active bleeding. Wound care given. He is stable for discharge home with his daughter. Case discussed with Dr. Blinda Leatherwood who agrees with plan of care. Return precautions discussed. Patient and daughter state understanding of plan and are agreeable.        Trevor Mace, PA-C 07/20/12 2052

## 2012-07-20 NOTE — ED Notes (Signed)
MD at bedside. Dr. Pollina at bedside.  

## 2012-07-20 NOTE — ED Notes (Signed)
Pt has a broken left arm from previous fall in feb 2014

## 2012-07-20 NOTE — ED Notes (Signed)
Pt was walking up stairs tot he back of his house and tripped. Pt slid down brick wall on left arm. Large skin tear from upper arm to elbow. Pt also has skin tear on left shoulder .  Daughter lives with pt and she is at bedside

## 2012-07-20 NOTE — ED Provider Notes (Signed)
Medical screening examination/treatment/procedure(s) were conducted as a shared visit with non-physician practitioner(s) and myself.  I personally evaluated the patient during the encounter.  Patient presents to the ER for evaluation after a fall. Examination reveal a large skin tear on the left upper arm. X-rays of his arm were negative. He complained about hip pain as well, x-ray was negative. Patient provided local wound care and instructions on care the wound at home.  Gilda Crease, MD 07/20/12 2144

## 2012-07-21 DIAGNOSIS — IMO0001 Reserved for inherently not codable concepts without codable children: Secondary | ICD-10-CM

## 2012-07-21 DIAGNOSIS — Z9181 History of falling: Secondary | ICD-10-CM

## 2012-07-21 DIAGNOSIS — M6281 Muscle weakness (generalized): Secondary | ICD-10-CM

## 2012-07-21 DIAGNOSIS — R262 Difficulty in walking, not elsewhere classified: Secondary | ICD-10-CM

## 2012-07-22 ENCOUNTER — Encounter: Payer: Self-pay | Admitting: Internal Medicine

## 2012-07-23 ENCOUNTER — Ambulatory Visit (INDEPENDENT_AMBULATORY_CARE_PROVIDER_SITE_OTHER): Payer: Medicare Other | Admitting: Internal Medicine

## 2012-07-23 ENCOUNTER — Encounter: Payer: Self-pay | Admitting: Internal Medicine

## 2012-07-23 ENCOUNTER — Encounter: Payer: Self-pay | Admitting: *Deleted

## 2012-07-23 VITALS — BP 122/68 | HR 60 | Temp 95.0°F | Resp 10 | Ht 66.5 in | Wt 158.6 lb

## 2012-07-23 DIAGNOSIS — S52532D Colles' fracture of left radius, subsequent encounter for closed fracture with routine healing: Secondary | ICD-10-CM

## 2012-07-23 DIAGNOSIS — I10 Essential (primary) hypertension: Secondary | ICD-10-CM

## 2012-07-23 DIAGNOSIS — S5290XD Unspecified fracture of unspecified forearm, subsequent encounter for closed fracture with routine healing: Secondary | ICD-10-CM

## 2012-07-23 DIAGNOSIS — R269 Unspecified abnormalities of gait and mobility: Secondary | ICD-10-CM

## 2012-07-23 DIAGNOSIS — S41109A Unspecified open wound of unspecified upper arm, initial encounter: Secondary | ICD-10-CM | POA: Insufficient documentation

## 2012-07-23 DIAGNOSIS — Z5189 Encounter for other specified aftercare: Secondary | ICD-10-CM

## 2012-07-23 DIAGNOSIS — S41109D Unspecified open wound of unspecified upper arm, subsequent encounter: Secondary | ICD-10-CM

## 2012-07-23 NOTE — Progress Notes (Signed)
Subjective:    Patient ID: Victor Buck, male    DOB: Oct 29, 1919, 79 y.o.   MRN: [id number]  HPI Patient fell downstairs and required a trip to the emergency room. Multiple skin tears were treated. Victor Buck would be a simple mechanical fall. While in the emergency room he had a normal CBC, BMP, and protime. X-rays were done of the left elbow, left humerus, left hip, and left forearm. He had findings of a comminuted distal radial diaphysis fracture which appeared unchanged compared to a prior exam of May 18, 2012.This fracture occurred originally on May 04, 2012 when he fell on another occasion. This fracture appears to be healing. The remainder of the x-ray 4 showed no acute abnormalities.  Although he still has bandages and a splint because of his injuries, he says he is feeling well on today's visit.   Review of Systems  Constitutional: Negative for fever, chills, weight loss, malaise/fatigue and diaphoresis.  HENT: Positive for hearing loss. Negative for ear pain, congestion, neck pain, tinnitus and ear discharge.   Eyes: Negative for blurred vision, double vision, photophobia, pain, discharge and redness.  Respiratory: Negative for cough, hemoptysis, sputum production and shortness of breath.   Cardiovascular: Negative for chest pain, palpitations, orthopnea, claudication, leg swelling and PND.  Gastrointestinal: Negative for heartburn, nausea, vomiting, abdominal pain, diarrhea and constipation.  Endocrine: Negative for polydipsia.  Genitourinary: Negative for dysuria, urgency and frequency.  Musculoskeletal: Positive for falls. Negative for myalgias and back pain.       History of multiple falls.  Skin: Negative for itching and rash.  Allergic/Immunologic: Negative for environmental allergies.  Neurological: Negative for dizziness, tingling, tremors, sensory change, speech change, focal weakness, seizures, weakness and headaches.  Hematological: Does not bruise/bleed  easily.  Psychiatric/Behavioral: Negative for depression, hallucinations, memory loss and substance abuse. The patient is not nervous/anxious and does not have insomnia.        Objective:BP 122/68  Pulse 60  Temp(Src) 95 F (35 C)  Resp 10  Ht 5' 6.5" (1.689 m)  Wt 158 lb 9.6 oz (71.94 kg)  BMI 25.22 kg/m2    Physical Exam  Constitutional: He is oriented to person, place, and time. No distress.  Frail  HENT:  Head: Atraumatic.  Loss of hearing bilaterally  Neck: Neck supple. No JVD present. No tracheal deviation present. No thyromegaly present.  Cardiovascular: Normal rate, regular rhythm, normal heart sounds and intact distal pulses.  Exam reveals no gallop and no friction rub.   No murmur heard. Pulmonary/Chest: Effort normal and breath sounds normal. No respiratory distress. He has no wheezes. He has no rales.  Abdominal: Bowel sounds are normal. He exhibits no distension. There is no tenderness. There is no rebound.  Musculoskeletal: Normal range of motion. He exhibits no edema and no tenderness.  Cast on the left forearm.  Lymphadenopathy:    He has no cervical adenopathy.  Neurological: He is alert and oriented to person, place, and time. He has normal reflexes. No cranial nerve deficit. Coordination normal.  Skin: Skin is warm and dry. No rash noted. He is not diaphoretic. No erythema. No pallor.  Multiple skin tears.  Psychiatric: He has a normal mood and affect. His behavior is normal. Judgment and thought content normal.          Assessment & Plan:  1. Colles' fracture of left radius, closed, with routine healing, subsequent encounter Continue with orthopedic followup  2. Abnormality of gait Recommended use of cane or walker. Patient  is at high risk for additional falls. Physical therapy should be tried and are to maximize his understanding of risk factors for additional falls.  3. Open wound of arm, unspecified laterality, subsequent encounter Continue  managing in wound care. Return in 2 weeks for reinspection or sooner if needed for change in condition of the wound.  4. HTN (hypertension) Stable

## 2012-07-23 NOTE — Patient Instructions (Signed)
Continue to change dressings every 1-3 days depending on wound drainage. You should be using a walker for stability. Continue physical therapy.

## 2012-07-24 ENCOUNTER — Encounter: Payer: Self-pay | Admitting: Internal Medicine

## 2012-08-13 ENCOUNTER — Ambulatory Visit (INDEPENDENT_AMBULATORY_CARE_PROVIDER_SITE_OTHER): Payer: Medicare Other | Admitting: Internal Medicine

## 2012-08-13 ENCOUNTER — Encounter: Payer: Self-pay | Admitting: Geriatric Medicine

## 2012-08-13 ENCOUNTER — Encounter: Payer: Self-pay | Admitting: Internal Medicine

## 2012-08-13 VITALS — BP 190/110 | HR 49 | Temp 98.0°F | Resp 12 | Ht 66.5 in | Wt 156.0 lb

## 2012-08-13 DIAGNOSIS — IMO0002 Reserved for concepts with insufficient information to code with codable children: Secondary | ICD-10-CM

## 2012-08-13 DIAGNOSIS — R05 Cough: Secondary | ICD-10-CM | POA: Insufficient documentation

## 2012-08-13 DIAGNOSIS — S41102S Unspecified open wound of left upper arm, sequela: Secondary | ICD-10-CM

## 2012-08-13 DIAGNOSIS — S42309S Unspecified fracture of shaft of humerus, unspecified arm, sequela: Secondary | ICD-10-CM

## 2012-08-13 DIAGNOSIS — S52532S Colles' fracture of left radius, sequela: Secondary | ICD-10-CM

## 2012-08-13 DIAGNOSIS — I10 Essential (primary) hypertension: Secondary | ICD-10-CM

## 2012-08-13 DIAGNOSIS — R269 Unspecified abnormalities of gait and mobility: Secondary | ICD-10-CM

## 2012-08-13 MED ORDER — DEXTROMETHORPHAN POLISTIREX 30 MG/5ML PO LQCR: 60.0000 mg | ORAL | Status: DC | PRN

## 2012-08-13 NOTE — Progress Notes (Signed)
Subjective:    Patient ID: Victor Buck, male    DOB: [birth year], 79 y.o.   MRN: [id number]  HPI Cough: No complaint. Both patient and his daughter felt a "cold" for the last week. Patient has had a cough for at least 3 weeks. He has not run any fever. He denies a sore throat. He has not had any chest pain.  Open wound of arm, left, sequela:: Wound is healing and looks very good today.  Abnormality of gait: Patient is enrolled in physical therapy and believes he is benefiting from this. He feels more stable and walking. He has a walker at home but tends to use a cane with a triangular base preferentially. No further falls.  HTN (hypertension): Excellent control  Colles' fracture of left radius, sequela: Cast and splint to been removed. There still some swelling at the left wrist. He feels it is getting back to normal. He is using it as usual in his daily routines.   Current Outpatient Prescriptions on File Prior to Visit  Medication Sig Dispense Refill  . aspirin 81 MG tablet Take 81 mg by mouth daily.      . cholecalciferol (VITAMIN D) 1000 UNITS tablet Take 1,000 Units by mouth daily. Take 2 capsules daily for Vitamin D supplement.      . enalapril (VASOTEC) 20 MG tablet I/2 tablet daily to control BP  30 tablet  1  . metoprolol succinate (TOPROL-XL) 25 MG 24 hr tablet Take 25 mg by mouth daily.       No current facility-administered medications on file prior to visit.     Review of Systems  Constitutional: Negative for fever, chills, weight loss, malaise/fatigue and diaphoresis.  HENT: Positive for hearing loss. Negative for ear pain, congestion, neck pain, tinnitus and ear discharge.   Eyes: Negative for blurred vision, double vision, photophobia, pain, discharge and redness.  Respiratory: Negative for cough, hemoptysis, sputum production and shortness of breath.   Cardiovascular: Negative for chest pain, palpitations, orthopnea, claudication, leg swelling and PND.   Gastrointestinal: Negative for heartburn, nausea, vomiting, abdominal pain, diarrhea and constipation.  Endocrine: Negative for polydipsia.  Genitourinary: Negative for dysuria, urgency and frequency.  Musculoskeletal: Positive for falls. Negative for myalgias and back pain.       History of multiple falls.  Skin: Negative for itching and rash.  Allergic/Immunologic: Negative for environmental allergies.  Neurological: Negative for dizziness, tingling, tremors, sensory change, speech change, focal weakness, seizures, weakness and headaches.  Hematological: Does not bruise/bleed easily.  Psychiatric/Behavioral: Negative for depression, hallucinations, memory loss and substance abuse. The patient is not nervous/anxious and does not have insomnia.        Objective: BP 190/110  Pulse 49  Temp(Src) 98 F (36.7 C) (Oral)  Resp 12  Ht 5' 6.5" (1.689 m)  Wt 156 lb (70.761 kg)  BMI 24.8 kg/m2  SpO2 99%    Physical Exam  Constitutional: He is oriented to person, place, and time. No distress.  Frail  HENT:  Head: Atraumatic.  Loss of hearing bilaterally  Neck: Neck supple. No JVD present. No tracheal deviation present. No thyromegaly present.  Cardiovascular: Normal rate, regular rhythm, normal heart sounds and intact distal pulses.  Exam reveals no gallop and no friction rub.   No murmur heard. Pulmonary/Chest: Effort normal and breath sounds normal. No respiratory distress. He has no wheezes. He has no rales.  Abdominal: Bowel sounds are normal. He exhibits no distension. There is no tenderness. There is no  rebound.  Musculoskeletal: Normal range of motion. He exhibits no edema and no tenderness.  Mild residual swelling of the left wrist and forearm. No significant tenderness. Mild restriction in rotation movements wrist.  Lymphadenopathy:    He has no cervical adenopathy.  Neurological: He is alert and oriented to person, place, and time. He has normal reflexes. No cranial nerve  deficit. Coordination normal.  Skin: Skin is warm and dry. No rash noted. He is not diaphoretic. No erythema. No pallor.  Multiple skin tears.  Psychiatric: He has a normal mood and affect. His behavior is normal. Judgment and thought content normal.      Assessment & Plan:  1. Cough Persistent bronchitis in a smoker. Patient must stop smoking. - dextromethorphan (DELSYM) 30 MG/5ML liquid; Take 10 mLs (60 mg total) by mouth as needed for cough.  Dispense: 89 mL; Refill: 0  2. Open wound of arm, left, sequela Excellent healing. Patient may allow the wound to be opened since it is no longer oozing.  3. Abnormality of gait Continue physical therapy to maximum benefit. Discussed use of the walker instead of a cane with the patient, but he prefers a cane at this time.  4. HTN (hypertension) Controlled  5. Colles' fracture of left radius, sequela Resolved

## 2012-08-13 NOTE — Patient Instructions (Signed)
You may leave wound open if it is no longer weeping.  Try Delsym for the cough. Call if you run fever.

## 2012-08-21 ENCOUNTER — Other Ambulatory Visit: Payer: Self-pay | Admitting: *Deleted

## 2012-08-21 NOTE — Telephone Encounter (Signed)
[facility] wanted to extend PT with patient two time a week for more week. 1 time a week for 1 week . Patient had a fall yesterday no injury.

## 2012-09-16 ENCOUNTER — Other Ambulatory Visit: Payer: Medicare Other

## 2012-09-21 DIAGNOSIS — M4000 Postural kyphosis, site unspecified: Secondary | ICD-10-CM | POA: Insufficient documentation

## 2012-10-10 ENCOUNTER — Other Ambulatory Visit: Payer: Self-pay | Admitting: *Deleted

## 2012-10-10 DIAGNOSIS — E119 Type 2 diabetes mellitus without complications: Secondary | ICD-10-CM

## 2012-10-10 DIAGNOSIS — E785 Hyperlipidemia, unspecified: Secondary | ICD-10-CM

## 2012-10-10 DIAGNOSIS — I10 Essential (primary) hypertension: Secondary | ICD-10-CM

## 2012-10-15 ENCOUNTER — Other Ambulatory Visit: Payer: Self-pay

## 2012-10-17 ENCOUNTER — Other Ambulatory Visit: Payer: Medicare Other

## 2012-10-17 DIAGNOSIS — E785 Hyperlipidemia, unspecified: Secondary | ICD-10-CM

## 2012-10-17 DIAGNOSIS — I10 Essential (primary) hypertension: Secondary | ICD-10-CM

## 2012-10-17 DIAGNOSIS — E119 Type 2 diabetes mellitus without complications: Secondary | ICD-10-CM

## 2012-10-21 ENCOUNTER — Encounter: Payer: Self-pay | Admitting: Internal Medicine

## 2012-10-21 ENCOUNTER — Ambulatory Visit (INDEPENDENT_AMBULATORY_CARE_PROVIDER_SITE_OTHER): Payer: Medicare Other | Admitting: Internal Medicine

## 2012-10-21 VITALS — BP 112/72 | HR 82 | Temp 97.4°F | Resp 16 | Ht 66.0 in | Wt 157.4 lb

## 2012-10-21 DIAGNOSIS — E785 Hyperlipidemia, unspecified: Secondary | ICD-10-CM

## 2012-10-21 DIAGNOSIS — M4000 Postural kyphosis, site unspecified: Secondary | ICD-10-CM

## 2012-10-21 DIAGNOSIS — E119 Type 2 diabetes mellitus without complications: Secondary | ICD-10-CM

## 2012-10-21 DIAGNOSIS — S42309S Unspecified fracture of shaft of humerus, unspecified arm, sequela: Secondary | ICD-10-CM

## 2012-10-21 DIAGNOSIS — I10 Essential (primary) hypertension: Secondary | ICD-10-CM

## 2012-10-21 DIAGNOSIS — S52532S Colles' fracture of left radius, sequela: Secondary | ICD-10-CM

## 2012-10-21 DIAGNOSIS — R269 Unspecified abnormalities of gait and mobility: Secondary | ICD-10-CM

## 2012-10-21 NOTE — Progress Notes (Signed)
Subjective:    Patient ID: Victor Buck, male    DOB: [birth year], 79 y.o.   MRN: [id number]  HPI   Current Outpatient Prescriptions on File Prior to Visit  Medication Sig Dispense Refill  . aspirin 81 MG tablet Take 81 mg by mouth daily.      . cholecalciferol (VITAMIN D) 1000 UNITS tablet Take 1,000 Units by mouth daily. Take 2 capsules daily for Vitamin D supplement.      Marland Kitchen dextromethorphan (DELSYM) 30 MG/5ML liquid Take 10 mLs (60 mg total) by mouth as needed for cough.  89 mL  0  . enalapril (VASOTEC) 20 MG tablet I/2 tablet daily to control BP  30 tablet  1  . metoprolol succinate (TOPROL-XL) 25 MG 24 hr tablet Take 25 mg by mouth daily.       No current facility-administered medications on file prior to visit.    Review of Systems  Constitutional: Negative for fever, chills, weight loss, malaise/fatigue and diaphoresis.  HENT: Positive for hearing loss. Negative for ear pain, congestion, neck pain, tinnitus and ear discharge.   Eyes: Negative for blurred vision, double vision, photophobia, pain, discharge and redness.  Respiratory: Negative for cough, hemoptysis, sputum production and shortness of breath.   Cardiovascular: Negative for chest pain, palpitations, orthopnea, claudication, leg swelling and PND.  Gastrointestinal: Negative for heartburn, nausea, vomiting, abdominal pain, diarrhea and constipation.  Endocrine: Negative for polydipsia.  Genitourinary: Negative for dysuria, urgency and frequency.  Musculoskeletal: Positive for falls. Negative for myalgias and back pain.       History of multiple falls.  Skin: Negative for itching and rash.  Allergic/Immunologic: Negative for environmental allergies.  Neurological: Negative for dizziness, tingling, tremors, sensory change, speech change, focal weakness, seizures, weakness and headaches.  Hematological: Does not bruise/bleed easily.  Psychiatric/Behavioral: Negative for depression, hallucinations, memory loss and  substance abuse. The patient is not nervous/anxious and does not have insomnia.        Objective:BP 112/72  Pulse 82  Temp(Src) 97.4 F (36.3 C) (Oral)  Resp 16  Ht 5\' 6"  (1.676 m)  Wt 157 lb 6.4 oz (71.396 kg)  BMI 25.42 kg/m2  SpO2 95%    Physical Exam  Constitutional: He is oriented to person, place, and time. No distress.  Frail  HENT:  Head: Atraumatic.  Loss of hearing bilaterally  Neck: Neck supple. No JVD present. No tracheal deviation present. No thyromegaly present.  Cardiovascular: Normal rate, regular rhythm, normal heart sounds and intact distal pulses.  Exam reveals no gallop and no friction rub.   No murmur heard. Pulmonary/Chest: Effort normal and breath sounds normal. No respiratory distress. He has no wheezes. He has no rales.  Abdominal: Bowel sounds are normal. He exhibits no distension. There is no tenderness. There is no rebound.  Musculoskeletal: Normal range of motion. He exhibits edema. He exhibits no tenderness.  Mild residual swelling of the left wrist and forearm. No significant tenderness. Mild restriction in rotation movements wrist.  Lymphadenopathy:    He has no cervical adenopathy.  Neurological: He is alert and oriented to person, place, and time. He has normal reflexes. No cranial nerve deficit. Coordination normal.  Skin: Skin is warm and dry. No rash noted. He is not diaphoretic. No erythema. No pallor.  Psychiatric: He has a normal mood and affect. His behavior is normal. Judgment and thought content normal.     No visits with results within 3 Month(s) from this visit. Latest known visit with results is:  Admission on 07/20/2012, Discharged on 07/20/2012  Component Date Value Range Status  . WBC 07/20/2012 6.1  4.0 - 10.5 K/uL Final  . RBC 07/20/2012 4.87  4.22 - 5.81 MIL/uL Final  . Hemoglobin 07/20/2012 14.1  13.0 - 17.0 g/dL Final  . HCT 78/29/5621 43.1  39.0 - 52.0 % Final  . MCV 07/20/2012 88.5  78.0 - 100.0 fL Final  . MCH  07/20/2012 29.0  26.0 - 34.0 pg Final  . MCHC 07/20/2012 32.7  30.0 - 36.0 g/dL Final  . RDW 30/86/5784 14.0  11.5 - 15.5 % Final  . Platelets 07/20/2012 142* 150 - 400 K/uL Final  . Sodium 07/20/2012 138  135 - 145 mEq/L Final  . Potassium 07/20/2012 3.8  3.5 - 5.1 mEq/L Final  . Chloride 07/20/2012 104  96 - 112 mEq/L Final  . CO2 07/20/2012 28  19 - 32 mEq/L Final  . Glucose, Bld 07/20/2012 92  70 - 99 mg/dL Final  . BUN 69/62/9528 26* 6 - 23 mg/dL Final  . Creatinine, Ser 07/20/2012 0.88  0.50 - 1.35 mg/dL Final  . Calcium 41/32/4401 9.2  8.4 - 10.5 mg/dL Final  . GFR calc non Af Amer 07/20/2012 73* >90 mL/min Final  . GFR calc Af Amer 07/20/2012 85* >90 mL/min Final   Comment:                                 The eGFR has been calculated                          using the CKD EPI equation.                          This calculation has not been                          validated in all clinical                          situations.                          eGFR's persistently                          <90 mL/min signify                          possible Chronic Kidney Disease.  Marland Kitchen Prothrombin Time 07/20/2012 13.6  11.6 - 15.2 seconds Final  . INR 07/20/2012 1.05  0.00 - 1.49 Final        Assessment & Plan:  HTN (hypertension): much improved - Plan: Basic metabolic panel  Abnormality of gait: unchanged  Other and unspecified hyperlipidemia: controlled  Type II or unspecified type diabetes mellitus without mention of complication, not stated as uncontrolled; controlled  Kyphosis (acquired) (postural): unchanged  Colles' fracture of left radius, sequela: resolved

## 2012-10-21 NOTE — Patient Instructions (Signed)
Continue current medications. 

## 2012-11-25 ENCOUNTER — Other Ambulatory Visit: Payer: Self-pay | Admitting: Internal Medicine

## 2012-12-30 ENCOUNTER — Ambulatory Visit (INDEPENDENT_AMBULATORY_CARE_PROVIDER_SITE_OTHER): Payer: Medicare Other | Admitting: Internal Medicine

## 2012-12-30 ENCOUNTER — Encounter: Payer: Self-pay | Admitting: Internal Medicine

## 2012-12-30 VITALS — BP 150/86 | HR 62 | Temp 97.4°F | Wt 156.8 lb

## 2012-12-30 DIAGNOSIS — N401 Enlarged prostate with lower urinary tract symptoms: Secondary | ICD-10-CM

## 2012-12-30 DIAGNOSIS — R350 Frequency of micturition: Secondary | ICD-10-CM

## 2012-12-30 DIAGNOSIS — I8310 Varicose veins of unspecified lower extremity with inflammation: Secondary | ICD-10-CM

## 2012-12-30 DIAGNOSIS — S42309S Unspecified fracture of shaft of humerus, unspecified arm, sequela: Secondary | ICD-10-CM

## 2012-12-30 DIAGNOSIS — I10 Essential (primary) hypertension: Secondary | ICD-10-CM

## 2012-12-30 DIAGNOSIS — R609 Edema, unspecified: Secondary | ICD-10-CM

## 2012-12-30 DIAGNOSIS — R269 Unspecified abnormalities of gait and mobility: Secondary | ICD-10-CM

## 2012-12-30 DIAGNOSIS — E119 Type 2 diabetes mellitus without complications: Secondary | ICD-10-CM

## 2012-12-30 DIAGNOSIS — R32 Unspecified urinary incontinence: Secondary | ICD-10-CM | POA: Insufficient documentation

## 2012-12-30 DIAGNOSIS — S52532S Colles' fracture of left radius, sequela: Secondary | ICD-10-CM

## 2012-12-30 DIAGNOSIS — E785 Hyperlipidemia, unspecified: Secondary | ICD-10-CM

## 2012-12-30 DIAGNOSIS — I4949 Other premature depolarization: Secondary | ICD-10-CM

## 2012-12-30 LAB — POCT URINALYSIS DIPSTICK
Bilirubin, UA: NEGATIVE
Blood, UA: NEGATIVE
Glucose, UA: NEGATIVE
Nitrite, UA: NEGATIVE
Spec Grav, UA: >=1.03
pH, UA: 5

## 2012-12-30 MED ORDER — TAMSULOSIN HCL 0.4 MG PO CAPS: 0.4000 mg | ORAL_CAPSULE | Freq: Every day | ORAL | Status: DC

## 2012-12-30 NOTE — Progress Notes (Signed)
Subjective:    Patient ID: Victor Buck, male    DOB: [birth year], 79 y.o.   MRN: [id number]   Chief Complaint  Patient presents with  . Hip Pain    x 2-3 months  . Urinary Frequency    3 weeks getting up 2-3 x a night    HPI Frequency of urination;  No dysuria  Hypertension; controlled  Hyperlipidemia: controlled  Varicose veins of lower extremities with inflammation, unspecified laterality: bilateral. Painless.  Edema: chronic. Controlled  Hypertrophy of prostate with urinary obstruction and other lower urinary tract symptoms (LUTS) - Plan: tamsulosin (FLOMAX) 0.4 MG CAPS capsule  Other premature beats: controlled. Observe.  Abnormality of gait: unstable. At risk for falls.  Type II or unspecified type diabetes mellitus without mention of complication, not stated as uncontrolled: improved  Colles' fracture of left radius, sequela: reslved  Other and unspecified hyperlipidemia: controlled  HTN (hypertension): controlled  Unspecified urinary incontinence; occassional.    Current Outpatient Prescriptions on File Prior to Visit  Medication Sig Dispense Refill  . aspirin 81 MG tablet Take 81 mg by mouth daily.      . cholecalciferol (VITAMIN D) 1000 UNITS tablet Take 1,000 Units by mouth daily. Take 2 capsules daily for Vitamin D supplement.      . enalapril (VASOTEC) 20 MG tablet I/2 tablet daily to control BP  30 tablet  1  . metoprolol tartrate (LOPRESSOR) 25 MG tablet TAKE (1) TABLET DAILY FOR HIGH BLOOD PRESSURE.  30 tablet  5   No current facility-administered medications on file prior to visit.    Review of Systems  Constitutional: Negative for fever, chills, weight loss, malaise/fatigue and diaphoresis.  HENT: Positive for hearing loss. Negative for congestion, ear discharge, ear pain and tinnitus.   Eyes: Negative for blurred vision, double vision, photophobia, pain, discharge and redness.  Respiratory: Negative for cough, hemoptysis, sputum  production and shortness of breath.   Cardiovascular: Negative for chest pain, palpitations, orthopnea, claudication, leg swelling and PND.  Gastrointestinal: Negative for heartburn, nausea, vomiting, abdominal pain, diarrhea and constipation.  Endocrine: Negative for cold intolerance, heat intolerance, polydipsia, polyphagia and polyuria.  Genitourinary: Positive for urgency, frequency and decreased urine volume. Negative for dysuria.  Musculoskeletal: Positive for falls. Negative for back pain, myalgias and neck pain.       History of multiple falls.  Skin: Negative for color change, itching, pallor and rash.  Allergic/Immunologic: Negative for environmental allergies.  Neurological: Negative for dizziness, tingling, tremors, sensory change, speech change, focal weakness, seizures, weakness and headaches.  Hematological: Does not bruise/bleed easily.  Psychiatric/Behavioral: Negative for depression, suicidal ideas, hallucinations, memory loss, behavioral problems, confusion, sleep disturbance, decreased concentration, substance abuse and agitation. The patient is not nervous/anxious and does not have insomnia.        Objective:BP 150/86  Pulse 62  Temp(Src) 97.4 F (36.3 C) (Oral)  Wt 156 lb 12.8 oz (71.124 kg)  BMI 25.32 kg/m2  SpO2 93%    Physical Exam  Constitutional: He is oriented to person, place, and time. No distress.  Frail  HENT:  Head: Normocephalic and atraumatic.  Right Ear: External ear normal.  Left Ear: External ear normal.  Nose: Nose normal.  Mouth/Throat: Oropharynx is clear and moist.  Loss of hearing bilaterally  Neck: Neck supple. No JVD present. No tracheal deviation present. No thyromegaly present.  Cardiovascular: Normal rate, regular rhythm, normal heart sounds and intact distal pulses.  Exam reveals no gallop and no friction rub.  No murmur heard. Pulmonary/Chest: Effort normal and breath sounds normal. No respiratory distress. He has no wheezes. He  has no rales.  Abdominal: Bowel sounds are normal. He exhibits no distension. There is no tenderness. There is no rebound.  Genitourinary:  Urgency, nocturia. 2 episodes of incontinence  Musculoskeletal: Normal range of motion. He exhibits edema. He exhibits no tenderness.  Mild residual swelling of the left wrist and forearm. No significant tenderness. Mild restriction in rotation movements wrist.  Lymphadenopathy:    He has no cervical adenopathy.  Neurological: He is alert and oriented to person, place, and time. He has normal reflexes. No cranial nerve deficit. Coordination normal.  Skin: Skin is warm and dry. No rash noted. He is not diaphoretic. No erythema. No pallor.  Skin cancer on scalp was frozen by Dr. Hortense Ramal.  Psychiatric: He has a normal mood and affect. His behavior is normal. Judgment and thought content normal.    Office Visit on 12/30/2012  Component Date Value Range Status  . HM Colonoscopy 04/07/2003 [facility],normal   Final  . Color, UA 12/30/2012 yellow   Final  . Clarity, UA 12/30/2012 clear   Final  . Glucose, UA 12/30/2012 neg   Final  . Bilirubin, UA 12/30/2012 neg   Final  . Ketones, UA 12/30/2012 neg   Final  . Spec Grav, UA 12/30/2012 >=1.030   Final  . Blood, UA 12/30/2012 neg   Final  . pH, UA 12/30/2012 5.0   Final  . Protein, UA 12/30/2012 neg   Final  . Urobilinogen, UA 12/30/2012 negative   Final  . Nitrite, UA 12/30/2012 neg   Final  . Leukocytes, UA 12/30/2012 Negative   Final         Assessment & Plan:  Frequency of urination - POC Urinalysis Dipstick was negative. Suspect symptom is related to BPH.  Hypertension: controlled  Hyperlipidemia: controlled  Varicose veins of lower extremities with inflammation, unspecified laterality: unchanged. Obsereve.  Edema: unchanged  Hypertrophy of prostate with urinary obstruction and other lower urinary tract symptoms (LUTS) - Plan: tamsulosin (FLOMAX) 0.4 MG CAPS  capsule  Other premature beats: occ. Obsereve  Abnormality of gait; at risk for falls.  Type II or unspecified type diabetes mellitus without mention of complication, not stated as uncontrolled: controlled  Colles' fracture of left radius, sequela: resolved  Other and unspecified hyperlipidemia: controlled  HTN (hypertension): controlled  Unspecified urinary incontinence: may need urology referral. Try tamsulosin first.

## 2012-12-30 NOTE — Patient Instructions (Addendum)
Continue current medications You may increase aspirin to one at breakfast, lunch, and at bedtime. Add tamsulosin to help the prostate.

## 2013-01-15 ENCOUNTER — Other Ambulatory Visit: Payer: Self-pay

## 2013-01-19 ENCOUNTER — Other Ambulatory Visit: Payer: Medicare Other

## 2013-01-21 ENCOUNTER — Ambulatory Visit: Payer: Medicare Other | Admitting: Internal Medicine

## 2013-01-23 ENCOUNTER — Encounter: Payer: Self-pay | Admitting: Internal Medicine

## 2013-01-24 ENCOUNTER — Other Ambulatory Visit: Payer: Self-pay | Admitting: Internal Medicine

## 2013-01-26 ENCOUNTER — Other Ambulatory Visit: Payer: Self-pay | Admitting: *Deleted

## 2013-01-26 MED ORDER — ENALAPRIL MALEATE 20 MG PO TABS: ORAL_TABLET | ORAL | Status: DC

## 2013-04-01 ENCOUNTER — Ambulatory Visit (INDEPENDENT_AMBULATORY_CARE_PROVIDER_SITE_OTHER): Payer: Medicare Other | Admitting: Internal Medicine

## 2013-04-01 ENCOUNTER — Encounter: Payer: Self-pay | Admitting: Internal Medicine

## 2013-04-01 VITALS — BP 130/82 | HR 51 | Temp 97.6°F | Wt 159.0 lb

## 2013-04-01 DIAGNOSIS — I498 Other specified cardiac arrhythmias: Secondary | ICD-10-CM

## 2013-04-01 DIAGNOSIS — R42 Dizziness and giddiness: Secondary | ICD-10-CM | POA: Insufficient documentation

## 2013-04-01 DIAGNOSIS — R269 Unspecified abnormalities of gait and mobility: Secondary | ICD-10-CM

## 2013-04-01 DIAGNOSIS — I10 Essential (primary) hypertension: Secondary | ICD-10-CM

## 2013-04-01 DIAGNOSIS — R001 Bradycardia, unspecified: Secondary | ICD-10-CM | POA: Insufficient documentation

## 2013-04-01 MED ORDER — METOPROLOL TARTRATE 12.5 MG HALF TABLET: 12.5000 mg | ORAL_TABLET | Freq: Every day | ORAL | Status: DC | PRN

## 2013-04-01 MED ORDER — MECLIZINE HCL 12.5 MG PO TABS: 12.5000 mg | ORAL_TABLET | Freq: Three times a day (TID) | ORAL | Status: AC | PRN

## 2013-04-01 NOTE — Progress Notes (Signed)
Patient ID: Victor Buck, male   DOB: January 28, 1920, 80 y.o.   MRN: [id number]    Chief Complaint  Patient presents with  . Dizziness    Patient c/o of dizziness at night "Felt like I was drunk and the bed was moving, lasted about 2-3 hours"    Allergies  Allergen Reactions  . Avodart [Dutasteride] Other (See Comments)    Nightmares  . Flomax [Tamsulosin Hcl] Other (See Comments)    Crazy dreams   HPI 80 y/o male pt here for acute visit. He had episode of dizziness lasting for 2-3 hours 2 days ago. It resolved by itself. He was laying down at that point. He denies having such episode before. He has been tired lately. He has unsteady gait and uses a walker. No recent falls reported but has hx of falls As per daughter, he was somewhat confused yesterday morning but has been doing fine after that  Review of Systems  Constitutional: Negative for fever, chills,diaphoresis.  HENT: Negative for congestion, hearing loss and sore throat.   Eyes: Negative for blurred vision, double vision and discharge.  Respiratory: Negative for cough, sputum production, shortness of breath and wheezing.   Cardiovascular: Negative for chest pain, palpitations, orthopnea and leg swelling.  Gastrointestinal: Negative for heartburn, nausea, vomiting, abdominal pain Genitourinary: Negative for dysuria, urgency, frequency and flank pain.  Musculoskeletal: unsteady gait Skin: Negative for itching and rash.  Neurological: no further dizziness Psychiatric/Behavioral: Negative for depression and memory loss. The patient is not nervous/anxious.    Past Medical History  Diagnosis Date  . Hypertension 02/16/2005  . Type II or unspecified type diabetes mellitus without mention of complication, not stated as uncontrolled 12/04/2006  . Unspecified vitamin D deficiency 08/04/2008  . Hyperlipidemia 12/04/2006  . Varicose veins of lower extremities with inflammation 12/04/2006  . Inguinal hernia without mention of  obstruction or gangrene, unilateral or unspecified, (not specified as recurrent) 05/28/2003  . Diverticulosis of colon (without mention of hemorrhage) 02/28/2005  . Acquired cyst of kidney 03/13/2003  . Hypertrophy of prostate with urinary obstruction and other lower urinary tract symptoms (LUTS) 08/08/2007  . 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 Unspecified hypertrophic and atrophic condition of skin   . Other seborrheic keratosis 08/08/2007  . Edema 12/04/2006  . Loss of weight 01/18/20012  . Dysuria 03/28/2012  . Hearing decreased   . Tobacco use disorder 12/19/2005  . Other premature beats 02/28/2005  . Cataract 2000    Dr. Emmit Pomfret  Right   Current Outpatient Prescriptions on File Prior to Visit  Medication Sig Dispense Refill  . aspirin 81 MG tablet Take 81 mg by mouth daily.      . cholecalciferol (VITAMIN D) 1000 UNITS tablet Take 1,000 Units by mouth daily. Take 2 capsules daily for Vitamin D supplement.      . enalapril (VASOTEC) 20 MG tablet Take one tablet by mouth once daily for blood pressure  30 tablet  3  . tamsulosin (FLOMAX) 0.4 MG CAPS capsule Take 1 capsule (0.4 mg total) by mouth daily.  30 capsule  5   No current facility-administered medications on file prior to visit.    Physical exam BP 130/82  Pulse 51  Temp(Src) 97.6 F (36.4 C) (Oral)  Wt 159 lb (72.122 kg)  SpO2 99%   General- elderly male in no acute distress Head- atraumatic, normocephalic, recent skin biopsy on his head Eyes- PERRLA, EOMI, no pallor, no icterus Neck- no lymphadenopathy Chest- no chest wall deformities,  no chest wall tenderness Cardiovascular- bradycardia  Respiratory- bilateral clear to auscultation, no wheeze, no rhonchi, no crackles Abdomen- bowel sounds present, soft, non tender Musculoskeletal- able to move all 4 extremities, using a walker, unsteady gait Neurological- no focal deficit Psychiatry- alert and oriented to person, place, normal mood and  affect  Labs- CBC    Component Value Date/Time   WBC 6.1 07/20/2012 1930   WBC 7.1 05/09/2012 1237   RBC 4.87 07/20/2012 1930   RBC 4.92 05/09/2012 1237   HGB 14.1 07/20/2012 1930   HGB 14.2 05/09/2012 1237   HCT 43.1 07/20/2012 1930   HCT 46.0 05/09/2012 1237   PLT 142* 07/20/2012 1930   MCV 88.5 07/20/2012 1930   MCV 93.4 05/09/2012 1237   MCH 29.0 07/20/2012 1930   MCH 28.9 05/09/2012 1237   MCHC 32.7 07/20/2012 1930   MCHC 30.9* 05/09/2012 1237   RDW 14.0 07/20/2012 1930    Assessment/plan  1. Hypertension Stable bp reading, continue lisinopril for now and change metoprolol 12.5 mg daily to as needed for SBP > 180 and DBP >100  2. Bradycardia Slow heart rate. This could be contributing some to the dizziness and fatigue. Will change metoprolol to prn for now for elevated bp readings only.   3. Dizziness Resolved. Differential are iatrogenic vs BPV at present. He is alert and oriented and no focal deficit neurologically. Meclizine 12.5 mg tid prn provided for the symptom if recurs. Explained to seek medical help if other symptoms are associated with the dizziness. Pt and daughter voices understanding this  4. Abnormality of gait Filled out physical therapy form to have PT initiated to help with gait trainign. Encouraged to use his walker all the time

## 2013-04-02 DIAGNOSIS — M6281 Muscle weakness (generalized): Secondary | ICD-10-CM

## 2013-04-02 DIAGNOSIS — IMO0001 Reserved for inherently not codable concepts without codable children: Secondary | ICD-10-CM

## 2013-04-02 DIAGNOSIS — R269 Unspecified abnormalities of gait and mobility: Secondary | ICD-10-CM

## 2013-04-02 DIAGNOSIS — I10 Essential (primary) hypertension: Secondary | ICD-10-CM

## 2013-04-03 ENCOUNTER — Telehealth: Payer: Self-pay | Admitting: *Deleted

## 2013-04-03 NOTE — Telephone Encounter (Signed)
Susan,daughter,Notified and agreed

## 2013-04-03 NOTE — Telephone Encounter (Signed)
He should then take vasotec 10 mg daily and cut lopressor to 12.5 mg and take half a tablet (12.5 mg) daily

## 2013-04-03 NOTE — Telephone Encounter (Signed)
Patient takes 2 different blood pressure medications. Taking 1/2 of Vasotec instead of 1/2 of Lopressor. They told you wrong at appointment. What should he be taking? Currently patient taking Vasotec 10mg  1/2 tablet once daily and taking Lopressor 25mg  One tablet once daily. So what should he be taking? Please advise.

## 2013-04-11 ENCOUNTER — Encounter (HOSPITAL_COMMUNITY): Payer: Self-pay | Admitting: Emergency Medicine

## 2013-04-11 ENCOUNTER — Inpatient Hospital Stay (HOSPITAL_COMMUNITY)
Admission: EM | Admit: 2013-04-11 | Discharge: 2013-04-15 | DRG: 535 | Disposition: A | Discharge status: Skilled Nursing Facility | Payer: Medicare Other | Attending: Internal Medicine | Admitting: Internal Medicine

## 2013-04-11 ENCOUNTER — Emergency Department (HOSPITAL_COMMUNITY): Payer: Medicare Other

## 2013-04-11 DIAGNOSIS — W010XXA Fall on same level from slipping, tripping and stumbling without subsequent striking against object, initial encounter: Secondary | ICD-10-CM | POA: Diagnosis present

## 2013-04-11 DIAGNOSIS — M4000 Postural kyphosis, site unspecified: Secondary | ICD-10-CM

## 2013-04-11 DIAGNOSIS — S329XXA Fracture of unspecified parts of lumbosacral spine and pelvis, initial encounter for closed fracture: Secondary | ICD-10-CM

## 2013-04-11 DIAGNOSIS — S40019A Contusion of unspecified shoulder, initial encounter: Secondary | ICD-10-CM | POA: Diagnosis present

## 2013-04-11 DIAGNOSIS — S32509A Unspecified fracture of unspecified pubis, initial encounter for closed fracture: Principal | ICD-10-CM | POA: Diagnosis present

## 2013-04-11 DIAGNOSIS — N138 Other obstructive and reflux uropathy: Secondary | ICD-10-CM

## 2013-04-11 DIAGNOSIS — R5381 Other malaise: Secondary | ICD-10-CM | POA: Diagnosis present

## 2013-04-11 DIAGNOSIS — E785 Hyperlipidemia, unspecified: Secondary | ICD-10-CM

## 2013-04-11 DIAGNOSIS — S32599A Other specified fracture of unspecified pubis, initial encounter for closed fracture: Secondary | ICD-10-CM

## 2013-04-11 DIAGNOSIS — I5033 Acute on chronic diastolic (congestive) heart failure: Secondary | ICD-10-CM

## 2013-04-11 DIAGNOSIS — I8310 Varicose veins of unspecified lower extremity with inflammation: Secondary | ICD-10-CM

## 2013-04-11 DIAGNOSIS — R609 Edema, unspecified: Secondary | ICD-10-CM

## 2013-04-11 DIAGNOSIS — E119 Type 2 diabetes mellitus without complications: Secondary | ICD-10-CM

## 2013-04-11 DIAGNOSIS — J9600 Acute respiratory failure, unspecified whether with hypoxia or hypercapnia: Secondary | ICD-10-CM | POA: Diagnosis not present

## 2013-04-11 DIAGNOSIS — Z7982 Long term (current) use of aspirin: Secondary | ICD-10-CM

## 2013-04-11 DIAGNOSIS — M25519 Pain in unspecified shoulder: Secondary | ICD-10-CM | POA: Diagnosis present

## 2013-04-11 DIAGNOSIS — R001 Bradycardia, unspecified: Secondary | ICD-10-CM

## 2013-04-11 DIAGNOSIS — Y92009 Unspecified place in unspecified non-institutional (private) residence as the place of occurrence of the external cause: Secondary | ICD-10-CM

## 2013-04-11 DIAGNOSIS — Z9181 History of falling: Secondary | ICD-10-CM

## 2013-04-11 DIAGNOSIS — I4949 Other premature depolarization: Secondary | ICD-10-CM

## 2013-04-11 DIAGNOSIS — R296 Repeated falls: Secondary | ICD-10-CM

## 2013-04-11 DIAGNOSIS — F05 Delirium due to known physiological condition: Secondary | ICD-10-CM

## 2013-04-11 DIAGNOSIS — R269 Unspecified abnormalities of gait and mobility: Secondary | ICD-10-CM

## 2013-04-11 DIAGNOSIS — R42 Dizziness and giddiness: Secondary | ICD-10-CM

## 2013-04-11 DIAGNOSIS — I10 Essential (primary) hypertension: Secondary | ICD-10-CM

## 2013-04-11 DIAGNOSIS — M19019 Primary osteoarthritis, unspecified shoulder: Secondary | ICD-10-CM | POA: Diagnosis present

## 2013-04-11 DIAGNOSIS — I498 Other specified cardiac arrhythmias: Secondary | ICD-10-CM | POA: Diagnosis present

## 2013-04-11 DIAGNOSIS — R404 Transient alteration of awareness: Secondary | ICD-10-CM | POA: Diagnosis present

## 2013-04-11 DIAGNOSIS — R5383 Other fatigue: Secondary | ICD-10-CM

## 2013-04-11 DIAGNOSIS — N401 Enlarged prostate with lower urinary tract symptoms: Secondary | ICD-10-CM | POA: Diagnosis present

## 2013-04-11 DIAGNOSIS — S52532A Colles' fracture of left radius, initial encounter for closed fracture: Secondary | ICD-10-CM

## 2013-04-11 DIAGNOSIS — R32 Unspecified urinary incontinence: Secondary | ICD-10-CM

## 2013-04-11 DIAGNOSIS — F0390 Unspecified dementia without behavioral disturbance: Secondary | ICD-10-CM | POA: Diagnosis present

## 2013-04-11 DIAGNOSIS — D696 Thrombocytopenia, unspecified: Secondary | ICD-10-CM

## 2013-04-11 DIAGNOSIS — F17200 Nicotine dependence, unspecified, uncomplicated: Secondary | ICD-10-CM | POA: Diagnosis present

## 2013-04-11 DIAGNOSIS — Z66 Do not resuscitate: Secondary | ICD-10-CM | POA: Diagnosis present

## 2013-04-11 DIAGNOSIS — D649 Anemia, unspecified: Secondary | ICD-10-CM

## 2013-04-11 DIAGNOSIS — IMO0002 Reserved for concepts with insufficient information to code with codable children: Secondary | ICD-10-CM

## 2013-04-11 LAB — BASIC METABOLIC PANEL
BUN: 26 mg/dL — AB (ref 6–23)
CO2: 26 mEq/L (ref 19–32)
Calcium: 8.9 mg/dL (ref 8.4–10.5)
Chloride: 104 mEq/L (ref 96–112)
Creatinine, Ser: 0.75 mg/dL (ref 0.50–1.35)
GFR calc Af Amer: >90 mL/min (ref >90)
GFR, EST NON AFRICAN AMERICAN: 78 mL/min — AB (ref >90)
GLUCOSE: 136 mg/dL — AB (ref 70–99)
Potassium: 4.1 mEq/L (ref 3.7–5.3)
Sodium: 142 mEq/L (ref 137–147)

## 2013-04-11 LAB — MAGNESIUM: Magnesium: 1.7 mg/dL (ref 1.5–2.5)

## 2013-04-11 LAB — CBC WITH DIFFERENTIAL/PLATELET
Basophils Absolute: 0 10*3/uL (ref 0.0–0.1)
Basophils Relative: 0 % (ref 0–1)
EOS ABS: 0 10*3/uL (ref 0.0–0.7)
Eosinophils Relative: 0 % (ref 0–5)
HCT: 45.3 % (ref 39.0–52.0)
Hemoglobin: 14.7 g/dL (ref 13.0–17.0)
Lymphocytes Relative: 6 % — ABNORMAL LOW (ref 12–46)
Lymphs Abs: 0.7 10*3/uL (ref 0.7–4.0)
MCH: 29.2 pg (ref 26.0–34.0)
MCHC: 32.5 g/dL (ref 30.0–36.0)
MCV: 90.1 fL (ref 78.0–100.0)
Monocytes Absolute: 0.9 10*3/uL (ref 0.1–1.0)
Monocytes Relative: 8 % (ref 3–12)
NEUTROS PCT: 86 % — AB (ref 43–77)
Neutro Abs: 9.9 10*3/uL — ABNORMAL HIGH (ref 1.7–7.7)
PLATELETS: 132 10*3/uL — AB (ref 150–400)
RBC: 5.03 MIL/uL (ref 4.22–5.81)
RDW: 14.3 % (ref 11.5–15.5)
WBC: 11.5 10*3/uL — ABNORMAL HIGH (ref 4.0–10.5)

## 2013-04-11 LAB — HEMOGLOBIN A1C
Hgb A1c MFr Bld: 5.6 % (ref <5.7)
Mean Plasma Glucose: 114 mg/dL (ref <117)

## 2013-04-11 LAB — TSH: TSH: 0.856 u[IU]/mL (ref 0.350–4.500)

## 2013-04-11 LAB — T4, FREE: Free T4: 1.38 ng/dL (ref 0.80–1.80)

## 2013-04-11 MED ORDER — ASPIRIN EC 81 MG PO TBEC
81.0000 mg | DELAYED_RELEASE_TABLET | Freq: Every morning | ORAL | Status: DC
  Administered 2013-04-12 – 2013-04-15 (×4): 81 mg via ORAL
  Filled 2013-04-11 (×4): qty 1

## 2013-04-11 MED ORDER — VITAMIN D3 25 MCG (1000 UNIT) PO TABS
2000.0000 [IU] | ORAL_TABLET | Freq: Every day | ORAL | Status: DC
  Administered 2013-04-12 – 2013-04-15 (×4): 2000 [IU] via ORAL
  Filled 2013-04-11 (×4): qty 2

## 2013-04-11 MED ORDER — SODIUM CHLORIDE 0.9 % IV SOLN
INTRAVENOUS | Status: DC
  Administered 2013-04-11: 19:00:00 via INTRAVENOUS
  Administered 2013-04-12: 10 mL/h via INTRAVENOUS
  Administered 2013-04-12: 09:00:00 via INTRAVENOUS

## 2013-04-11 MED ORDER — ENALAPRIL MALEATE 20 MG PO TABS
20.0000 mg | ORAL_TABLET | Freq: Every day | ORAL | Status: DC
  Administered 2013-04-12 – 2013-04-15 (×4): 20 mg via ORAL
  Filled 2013-04-11 (×4): qty 1

## 2013-04-11 MED ORDER — ACETAMINOPHEN 325 MG PO TABS: 650.0000 mg | ORAL_TABLET | Freq: Four times a day (QID) | ORAL | Status: DC | PRN

## 2013-04-11 MED ORDER — ENOXAPARIN SODIUM 40 MG/0.4ML ~~LOC~~ SOLN
40.0000 mg | SUBCUTANEOUS | Status: DC
Start: 2013-04-11 — End: 2013-04-15
  Administered 2013-04-11 – 2013-04-14 (×4): 40 mg via SUBCUTANEOUS
  Filled 2013-04-11 (×5): qty 0.4

## 2013-04-11 MED ORDER — HYDROCODONE-ACETAMINOPHEN 5-325 MG PO TABS
1.0000 | ORAL_TABLET | ORAL | Status: DC | PRN
  Administered 2013-04-11 – 2013-04-15 (×5): 1 via ORAL
  Filled 2013-04-11 (×5): qty 1

## 2013-04-11 MED ORDER — METOPROLOL TARTRATE 12.5 MG HALF TABLET
12.5000 mg | ORAL_TABLET | Freq: Every day | ORAL | Status: DC
  Administered 2013-04-11 – 2013-04-13 (×3): 12.5 mg via ORAL
  Filled 2013-04-11 (×3): qty 1

## 2013-04-11 MED ORDER — HYDROMORPHONE HCL PF 1 MG/ML IJ SOLN
0.5000 mg | INTRAMUSCULAR | Status: DC | PRN
  Administered 2013-04-11 – 2013-04-13 (×5): 0.5 mg via INTRAVENOUS
  Filled 2013-04-11 (×5): qty 1

## 2013-04-11 MED ORDER — ONDANSETRON HCL 4 MG/2ML IJ SOLN: 4.0000 mg | Freq: Four times a day (QID) | INTRAMUSCULAR | Status: DC | PRN

## 2013-04-11 MED ORDER — ACETAMINOPHEN 325 MG PO TABS
650.0000 mg | ORAL_TABLET | Freq: Once | ORAL | Status: AC
  Administered 2013-04-11: 650 mg via ORAL
  Filled 2013-04-11: qty 2

## 2013-04-11 NOTE — H&P (Signed)
[facility] History and Physical  Victor NeerJohn A Englander WUJ:[id number] DOB: 16-Sep-1919 DOA: 04/11/2013  Referring physician: EDP PCP: Kimber RelicGREEN, ARTHUR G, MD   Chief Complaint: fall/hip pain  HPI: Victor Buck is a 80 y.o. male with PMH of HTN, gait disorder, controlled DM, presents to the ER with the above complaint. He has long standing gait/balance problems and is supposed to use a walker at all times which he tries to adhere to as much as possible. He was seen by his PCP's associate 10 days ago following an episode of dizziness that lasted couple of hours, this resolved spontaneously and has not recurred, at that time his metoprolol dose was cut in half from 25 to 12.5mg . Today he was sitting in his chair and watching TV, when the telephone rang, he got out of the chair quickly, held his walker and turned around to get to the telephone when he stumbled and fell, subsequently started experiencing pain in his L shoulder and L hip and was brought to the ER by EMS. He denies any dizziness, palpitations, chest pain or shortness of breath today. In ER, noted to have Left superior and inferior pubic rami fractures and [facility] was consulted along with Orthopedics.   Review of Systems: chronic Constitutional:  No weight loss, night sweats, Fevers, chills, fatigue.  HEENT:  No headaches, Difficulty swallowing,Tooth/dental problems,Sore throat,  No sneezing, itching, ear ache, nasal congestion, post nasal drip,  Cardio-vascular:  No chest pain, Orthopnea, PND, swelling in lower extremities, anasarca, dizziness, palpitations  GI:  No heartburn, indigestion, abdominal pain, nausea, vomiting, diarrhea, change in bowel habits, loss of appetite  Resp:  No shortness of breath with exertion or at rest. No excess mucus, no productive cough, No non-productive cough, No coughing up of blood.No change in color of mucus.No wheezing.No chest wall deformity  Skin:  no rash or lesions.  GU:  no dysuria,  change in color of urine, no urgency or frequency. No flank pain.  Musculoskeletal:  No joint pain or swelling. No decreased range of motion. No back pain.  Psych:  No change in mood or affect. No depression or anxiety. No memory loss.   Past Medical History  Diagnosis Date  . Hypertension 02/16/2005  . Type II or unspecified type diabetes mellitus without mention of complication, not stated as uncontrolled 12/04/2006  . Unspecified vitamin D deficiency 08/04/2008  . Hyperlipidemia 12/04/2006  . Varicose veins of lower extremities with inflammation 12/04/2006  . Inguinal hernia without mention of obstruction or gangrene, unilateral or unspecified, (not specified as recurrent) 05/28/2003  . Diverticulosis of colon (without mention of hemorrhage) 02/28/2005  . Acquired cyst of kidney 03/13/2003  . Hypertrophy of prostate with urinary obstruction and other lower urinary tract symptoms (LUTS) 08/08/2007  . 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 Unspecified hypertrophic and atrophic condition of skin   . Other seborrheic keratosis 08/08/2007  . Edema 12/04/2006  . Loss of weight 01/18/20012  . Dysuria 03/28/2012  . Hearing decreased   . Tobacco use disorder 12/19/2005  . Other premature beats 02/28/2005  . Cataract 2000    Dr. Emmit PomfretEpes  Right   Past Surgical History  Procedure Laterality Date  . Tonsillectomy  1947  . Dupuytren contracture release  1994    Dr. Metro KungMeyerdierks  . Eye surgery Left 2000    Dr. Emmit PomfretEpes  . Hernia repair Right 2005    Dr. Jamey RipaStreck  . Cataract extraction w/ intraocular lens implant Right 2005    Dr. Emmit PomfretEpes  .  Appendectomy  1946   Social History:  reports that he has been smoking Cigarettes.  He has a 35 pack-year smoking history. He does not have any smokeless tobacco history on file. He reports that he drinks about 0.6 ounces of alcohol per week. He reports that he does not use illicit drugs.  Allergies  Allergen Reactions  . Avodart [Dutasteride] Other (See  Comments)    Nightmares  . Flomax [Tamsulosin Hcl] Other (See Comments)    Crazy dreams    Family History  Problem Relation Age of Onset  . AAA (abdominal aortic aneurysm) Mother   . Unexplained death Father   . Unexplained death Sister   . Unexplained death Brother   . Unexplained death Brother   . Unexplained death Brother   . Unexplained death Sister      Prior to Admission medications   Medication Sig Start Date End Date Taking? Authorizing Provider  aspirin EC 81 MG tablet Take 81 mg by mouth every morning.   Yes Historical Provider, MD  cholecalciferol (VITAMIN D) 1000 UNITS tablet Take 2,000 Units by mouth daily.    Yes Historical Provider, MD  enalapril (VASOTEC) 20 MG tablet Take 20 mg by mouth daily.   Yes Historical Provider, MD  meclizine (ANTIVERT) 12.5 MG tablet Take 1 tablet (12.5 mg total) by mouth 3 (three) times daily as needed for dizziness. 04/01/13  Yes Mahima Glade Lloyd, MD  metoprolol tartrate (LOPRESSOR) 25 MG tablet Take 12.5 mg by mouth daily.   Yes Historical Provider, MD   Physical Exam: Filed Vitals:   04/11/13 1700  BP: 153/83  Pulse: 89  Temp:   Resp: 23    BP 153/83  Pulse 89  Temp(Src) 98.3 F (36.8 C) (Oral)  Resp 23  SpO2 93%  General:  Appears calm and comfortable HEENT: scalp with large scabs from dermatological procedure/cryo Eyes: PERRL, normal lids, irises & conjunctiva ENT: grossly normal hearing, lips & tongue Neck: no LAD, masses or thyromegaly Cardiovascular: RRR, some skipped beats, Respiratory: CTA bilaterally, no w/r/r. Normal respiratory effort. Abdomen: soft, ntnd Skin: no rash or induration seen on limited exam Musculoskeletal: 2plus ankle swelling, pain with movement of L shoulder and in L pelvis  Psychiatric: grossly normal mood and affect, speech fluent and appropriate Neurologic: grossly non-focal.          Labs on Admission:  Basic Metabolic Panel:  Recent Labs Lab 04/11/13 1630  NA 142  K 4.1  CL 104   CO2 26  GLUCOSE 136*  BUN 26*  CREATININE 0.75  CALCIUM 8.9   Liver Function Tests: No results found for this basename: AST, ALT, ALKPHOS, BILITOT, PROT, ALBUMIN,  in the last 168 hours No results found for this basename: LIPASE, AMYLASE,  in the last 168 hours No results found for this basename: AMMONIA,  in the last 168 hours CBC:  Recent Labs Lab 04/11/13 1630  WBC 11.5*  NEUTROABS PENDING  HGB 14.7  HCT 45.3  MCV 90.1  PLT 132*   Cardiac Enzymes: No results found for this basename: CKTOTAL, CKMB, CKMBINDEX, TROPONINI,  in the last 168 hours  BNP (last 3 results) No results found for this basename: PROBNP,  in the last 8760 hours CBG: No results found for this basename: GLUCAP,  in the last 168 hours  Radiological Exams on Admission: Dg Chest 2 View  04/11/2013   CLINICAL DATA:  FALL, PAIN  EXAM: CHEST  2 VIEW  COMPARISON:  DG CHEST 2 VIEW dated 07/16/2003  FINDINGS: The lungs are hyperinflated likely secondary to COPD. There is no focal parenchymal opacity, pleural effusion, or pneumothorax. The heart and mediastinal contours are unremarkable.  The osseous structures are unremarkable.  IMPRESSION: No active cardiopulmonary disease.   Electronically Signed   By: Elige Ko   On: 04/11/2013 16:11   Dg Hip Complete Left  04/11/2013   CLINICAL DATA:  Pain, fall  EXAM: LEFT HIP - COMPLETE 2+ VIEW  COMPARISON:  None  FINDINGS: Diffuse osseous demineralization.  Minimal narrowing of the hip joints bilaterally.  Symmetric SI joints.  Mildly displaced fractures of the left superior and inferior pubic rami.  No definite posterior pelvic fracture is seen.  Proximal left femur appears intact.  IMPRESSION: Displaced left superior and inferior pubic rami fractures.  Osseous demineralization.   Electronically Signed   By: Ulyses Southward M.D.   On: 04/11/2013 16:09   Ct Head Wo Contrast  04/11/2013   CLINICAL DATA:  Fall and weakness  EXAM: CT HEAD WITHOUT CONTRAST  TECHNIQUE: Contiguous  axial images were obtained from the base of the skull through the vertex without intravenous contrast.  COMPARISON:  None.  FINDINGS: No skull fracture. No hemorrhage or extra-axial fluid. Moderate diffuse atrophy. Moderate low attenuation in the deep white matter. No evidence of large vascular territory infarct. Mild dilatation of the ventricles proportional to atrophy. Calcification of the carotid arteries.  IMPRESSION: Chronic involutional change with no acute findings.   Electronically Signed   By: Esperanza Heir M.D.   On: 04/11/2013 15:53   Dg Shoulder Left  04/11/2013   CLINICAL DATA:  Pain, fall  EXAM: LEFT SHOULDER - 2+ VIEW  COMPARISON:  Left humeral radiographs 07/20/2012  FINDINGS: Diffuse osseous demineralization.  AC joint alignment normal.  No acute fracture, dislocation or bone destruction.  Visualized left ribs appear intact.  IMPRESSION: No acute osseous abnormalities.   Electronically Signed   By: Ulyses Southward M.D.   On: 04/11/2013 16:10    EKG: Independently reviewed. NSR, bigeminy, non specific t wave changes  Assessment/Plan  1. Pelvic fracture -pain control -physical therapy -Ortho consulted per EDP  2. Frequent falls/gait disorder -needs encouragement to use walker at all times -check Orthostatics when possible since he is on an alpha blocker/flomax -Pt eval  3. L shoulder pain -could be sprain from fall, await Ortho eval -Xray L shoulder unremarkable -may need repeat imaging if worsens or persists   4. Bigeminy - monitor on tele -check Mag, TSH/T4 -will also check ECHO due to chronic lower extremity edema  5. HTN -continue enalapril and metoprolol  6. H/o controlled DM -check hbaic  DVT proph: lovenox  Code Status: DNR, per patient request Family Communication: d/w pt and daughter at bedside Disposition Plan: to be determined  Time spent:  [facility] Pager [phone]

## 2013-04-11 NOTE — ED Provider Notes (Signed)
I was asked to follow up on this patient by my partner Dr. Jodi MourningZavitz . I discussed the case with the hospitalist. Patient be admitted to a telemetry bed. I discussed the case with orthopedics. Call was returned to me by Dr.Xu.  He will see the patient in consult.  Rolland PorterMark Giovanna Kemmerer, MD 04/11/13 [phone]

## 2013-04-11 NOTE — ED Notes (Signed)
Called report to floor, RN unable to call report at this time. RN will call me back.

## 2013-04-11 NOTE — ED Provider Notes (Signed)
CSN: [id number]     Arrival date & time 04/11/13  1413 History   First MD Initiated Contact with Patient 04/11/13 1417     Chief Complaint  Patient presents with  . Fall   (Consider location/radiation/quality/duration/timing/severity/associated sxs/prior Treatment) HPI Comments: 80 yo male with htn, DM, lipids presents with left hip/ shoulder pain since fall this am.  Pt recalls details, feels he caught his foot.  He has had intermittent falls the past few weeks.  No blood thinners.  BP meds decreased recently.  Pt has pain in left hip with rom.  No head injury, HA, cp, sob or syncope.  Pt landed on left side, family heard the fall and went right away but unwitnessed.  Pt uses walker.   Patient is a 80 y.o. male presenting with fall. The history is provided by the patient and a relative.  Fall This is a new problem. Pertinent negatives include no chest pain, no abdominal pain, no headaches and no shortness of breath.    Past Medical History  Diagnosis Date  . Hypertension 02/16/2005  . Type II or unspecified type diabetes mellitus without mention of complication, not stated as uncontrolled 12/04/2006  . Unspecified vitamin D deficiency 08/04/2008  . Hyperlipidemia 12/04/2006  . Varicose veins of lower extremities with inflammation 12/04/2006  . Inguinal hernia without mention of obstruction or gangrene, unilateral or unspecified, (not specified as recurrent) 05/28/2003  . Diverticulosis of colon (without mention of hemorrhage) 02/28/2005  . Acquired cyst of kidney 03/13/2003  . Hypertrophy of prostate with urinary obstruction and other lower urinary tract symptoms (LUTS) 08/08/2007  . 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 Unspecified hypertrophic and atrophic condition of skin   . Other seborrheic keratosis 08/08/2007  . Edema 12/04/2006  . Loss of weight 01/18/20012  . Dysuria 03/28/2012  . Hearing decreased   . Tobacco use disorder 12/19/2005  . Other premature beats  02/28/2005  . Cataract 2000    Dr. Emmit Pomfret  Right   Past Surgical History  Procedure Laterality Date  . Tonsillectomy  1947  . Dupuytren contracture release  1994    Dr. Metro Kung  . Eye surgery Left 2000    Dr. Emmit Pomfret  . Hernia repair Right 2005    Dr. Jamey Ripa  . Cataract extraction w/ intraocular lens implant Right 2005    Dr. Emmit Pomfret  . Appendectomy  1946   Family History  Problem Relation Age of Onset  . AAA (abdominal aortic aneurysm) Mother   . Unexplained death Father   . Unexplained death Sister   . Unexplained death Brother   . Unexplained death Brother   . Unexplained death Brother   . Unexplained death Sister    History  Substance Use Topics  . Smoking status: Current Every Day Smoker -- 0.50 packs/day for 70 years    Types: Cigarettes  . Smokeless tobacco: Not on file  . Alcohol Use: 0.6 oz/week    1 Glasses of wine per week    Review of Systems  Constitutional: Negative for fever and chills.  HENT: Negative for congestion.   Eyes: Negative for visual disturbance.  Respiratory: Negative for shortness of breath.   Cardiovascular: Negative for chest pain.  Gastrointestinal: Negative for vomiting and abdominal pain.  Genitourinary: Negative for dysuria and flank pain.  Musculoskeletal: Positive for arthralgias. Negative for back pain, neck pain and neck stiffness.  Skin: Negative for rash.  Neurological: Negative for syncope, light-headedness and headaches.    Allergies  Avodart and  Flomax  Home Medications   Current Outpatient Rx  Name  Route  Sig  Dispense  Refill  . aspirin EC 81 MG tablet   Oral   Take 81 mg by mouth every morning.         . cholecalciferol (VITAMIN D) 1000 UNITS tablet   Oral   Take 2,000 Units by mouth daily.          . enalapril (VASOTEC) 20 MG tablet   Oral   Take 20 mg by mouth daily.         . meclizine (ANTIVERT) 12.5 MG tablet   Oral   Take 1 tablet (12.5 mg total) by mouth 3 (three) times daily as needed for  dizziness.   30 tablet   0   . metoprolol tartrate (LOPRESSOR) 25 MG tablet   Oral   Take 12.5 mg by mouth daily.          BP 171/90  Pulse 79  Temp(Src) 98.3 F (36.8 C) (Oral)  Resp 19  SpO2 93% Physical Exam  Nursing note and vitals reviewed. Constitutional: He appears well-developed and well-nourished.  HENT:  Head: Normocephalic and atraumatic.  Eyes: Conjunctivae are normal. Right eye exhibits no discharge. Left eye exhibits no discharge.  Neck: Normal range of motion. Neck supple. No tracheal deviation present.  Cardiovascular: Normal rate and regular rhythm.   Pulmonary/Chest: Effort normal and breath sounds normal.  Abdominal: Soft. He exhibits no distension. There is no tenderness. There is no guarding.  Musculoskeletal: He exhibits tenderness. He exhibits no edema.  Mild left shoulder pain with flexion, full rom and left hip with ext rotation, nv intact distal extremities No swelling to joints No pain with full rom of right hip, knees and ankle bilateral, neck.  Neurological: He is alert.  Skin: Skin is warm. No rash noted.  Psychiatric: He has a normal mood and affect.    ED Course  Procedures (including critical care time) Labs Review Labs Reviewed - No data to display Imaging Review Dg Chest 2 View  04/11/2013   CLINICAL DATA:  FALL, PAIN  EXAM: CHEST  2 VIEW  COMPARISON:  DG CHEST 2 VIEW dated 07/16/2003  FINDINGS: The lungs are hyperinflated likely secondary to COPD. There is no focal parenchymal opacity, pleural effusion, or pneumothorax. The heart and mediastinal contours are unremarkable.  The osseous structures are unremarkable.  IMPRESSION: No active cardiopulmonary disease.   Electronically Signed   By: Elige KoHetal  Patel   On: 04/11/2013 16:11   Dg Hip Complete Left  04/11/2013   CLINICAL DATA:  Pain, fall  EXAM: LEFT HIP - COMPLETE 2+ VIEW  COMPARISON:  None  FINDINGS: Diffuse osseous demineralization.  Minimal narrowing of the hip joints bilaterally.   Symmetric SI joints.  Mildly displaced fractures of the left superior and inferior pubic rami.  No definite posterior pelvic fracture is seen.  Proximal left femur appears intact.  IMPRESSION: Displaced left superior and inferior pubic rami fractures.  Osseous demineralization.   Electronically Signed   By: Ulyses SouthwardMark  Boles M.D.   On: 04/11/2013 16:09   Ct Head Wo Contrast  04/11/2013   CLINICAL DATA:  Fall and weakness  EXAM: CT HEAD WITHOUT CONTRAST  TECHNIQUE: Contiguous axial images were obtained from the base of the skull through the vertex without intravenous contrast.  COMPARISON:  None.  FINDINGS: No skull fracture. No hemorrhage or extra-axial fluid. Moderate diffuse atrophy. Moderate low attenuation in the deep white matter. No evidence of large  vascular territory infarct. Mild dilatation of the ventricles proportional to atrophy. Calcification of the carotid arteries.  IMPRESSION: Chronic involutional change with no acute findings.   Electronically Signed   By: Esperanza Heir M.D.   On: 04/11/2013 15:53   Dg Shoulder Left  04/11/2013   CLINICAL DATA:  Pain, fall  EXAM: LEFT SHOULDER - 2+ VIEW  COMPARISON:  Left humeral radiographs 07/20/2012  FINDINGS: Diffuse osseous demineralization.  AC joint alignment normal.  No acute fracture, dislocation or bone destruction.  Visualized left ribs appear intact.  IMPRESSION: No acute osseous abnormalities.   Electronically Signed   By: Ulyses Southward M.D.   On: 04/11/2013 16:10    EKG Interpretation   None     EKG not in muse  Date: 04/11/2013  Rate: 88  Rhythm: normal sinus rhythm bigeminy  QRS Axis: left  Intervals: QT prolonged  ST/T Wave abnormalities: nonspecific ST changes  Conduction Disutrbances:none  Narrative Interpretation:      MDM   1. Falls frequently   2. Closed fracture of multiple pubic rami   Hypoxia  Mechanical fall.  Pt at baseline in ED per family.  Xrays and CT head. Blood work added with rami fractures.  With age,  lives in a normal home pt will need admission for rehab/ pain control/ ortho consult/ ? CT.  EKG no acute findings.  O2 high 80's, no home O2, lungs clear, no recent surgery or blood clot hx, pt denies sob.   The patients results and plan were reviewed and discussed.   Any x-rays performed were personally reviewed by myself.   Differential diagnosis were considered with the presenting HPI.  EKG: reviewed  Admission/ observation were discussed with, patient and/or family and they are comfortable with the plan.  Filed Vitals:   04/11/13 1430 04/11/13 1504 04/11/13 1628  BP: 171/90  159/82  Pulse: 67 79 93  Temp: 98.3 F (36.8 C)    TempSrc: Oral    Resp: 25 19 25   SpO2: 85% 93% 93%   Signed out to follow up lab results and admit [facility].      Enid Skeens, MD 04/11/13 [phone]

## 2013-04-11 NOTE — ED Notes (Signed)
Bed: [id number] Expected date: 04/11/13 Expected time: 2:17 PM Means of arrival: Ambulance Comments: fall

## 2013-04-11 NOTE — ED Notes (Addendum)
Pt from home, lives w/daughter.  Per EMS report, pt has had increasingly worse issues w/gait over the last few weeks.  Larey SeatFell today while going to the phone falling onto carpet onto his Lt side.  C/O LT hip and LT shoulder pain.  NO LOC.  178/105 by EMS. BP meds are reported to have been reduces about 10days ago.

## 2013-04-11 NOTE — Progress Notes (Addendum)
Received report from Lauren ED RN, patient coming to 1414 room is ready. J.Taina Landry, RN

## 2013-04-12 LAB — BASIC METABOLIC PANEL
BUN: 23 mg/dL (ref 6–23)
CALCIUM: 7.9 mg/dL — AB (ref 8.4–10.5)
CO2: 28 meq/L (ref 19–32)
CREATININE: 0.75 mg/dL (ref 0.50–1.35)
Chloride: 105 mEq/L (ref 96–112)
GFR calc Af Amer: >90 mL/min (ref >90)
GFR calc non Af Amer: 78 mL/min — ABNORMAL LOW (ref >90)
Glucose, Bld: 111 mg/dL — ABNORMAL HIGH (ref 70–99)
Potassium: 3.6 mEq/L — ABNORMAL LOW (ref 3.7–5.3)
Sodium: 140 mEq/L (ref 137–147)

## 2013-04-12 LAB — CBC
HEMATOCRIT: 36.8 % — AB (ref 39.0–52.0)
Hemoglobin: 11.9 g/dL — ABNORMAL LOW (ref 13.0–17.0)
MCH: 28.9 pg (ref 26.0–34.0)
MCHC: 32.1 g/dL (ref 30.0–36.0)
MCV: 90 fL (ref 78.0–100.0)
Platelets: 134 10*3/uL — ABNORMAL LOW (ref 150–400)
RBC: 4.09 MIL/uL — ABNORMAL LOW (ref 4.22–5.81)
RDW: 14.4 % (ref 11.5–15.5)
WBC: 8.7 10*3/uL (ref 4.0–10.5)

## 2013-04-12 MED ORDER — MAGNESIUM SULFATE 40 MG/ML IJ SOLN
2.0000 g | Freq: Once | INTRAMUSCULAR | Status: AC
  Administered 2013-04-12: 2 g via INTRAVENOUS
  Filled 2013-04-12: qty 50

## 2013-04-12 NOTE — Consult Note (Addendum)
ORTHOPAEDIC CONSULTATION  REQUESTING PHYSICIAN: Victor FickleMackenzie Short, MD  Chief Complaint: Pelvic fx  HPI: Victor Buck is a 80 y.o. male who complains of pelvic pain s/p mechanical fall.  Denies LOC, neck pain, abd pain.    Past Medical History  Diagnosis Date  . Hypertension 02/16/2005  . Type II or unspecified type diabetes mellitus without mention of complication, not stated as uncontrolled 12/04/2006  . Unspecified vitamin D deficiency 08/04/2008  . Hyperlipidemia 12/04/2006  . Varicose veins of lower extremities with inflammation 12/04/2006  . Inguinal hernia without mention of obstruction or gangrene, unilateral or unspecified, (not specified as recurrent) 05/28/2003  . Diverticulosis of colon (without mention of hemorrhage) 02/28/2005  . Acquired cyst of kidney 03/13/2003  . Hypertrophy of prostate with urinary obstruction and other lower urinary tract symptoms (LUTS) 08/08/2007  . 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 Unspecified hypertrophic and atrophic condition of skin   . Other seborrheic keratosis 08/08/2007  . Edema 12/04/2006  . Loss of weight 01/18/20012  . Dysuria 03/28/2012  . Hearing decreased   . Tobacco use disorder 12/19/2005  . Other premature beats 02/28/2005  . Cataract 2000    Dr. Emmit PomfretEpes  Right   Past Surgical History  Procedure Laterality Date  . Tonsillectomy  1947  . Dupuytren contracture release  1994    Dr. Metro KungMeyerdierks  . Eye surgery Left 2000    Dr. Emmit PomfretEpes  . Hernia repair Right 2005    Dr. Jamey RipaStreck  . Cataract extraction w/ intraocular lens implant Right 2005    Dr. Emmit PomfretEpes  . Appendectomy  1946   History   Social History  . Marital Status: Widowed    Spouse Name: N/A    Number of Children: N/A  . Years of Education: N/A   Social History Main Topics  . Smoking status: Current Every Day Smoker -- 0.50 packs/day for 70 years    Types: Cigarettes  . Smokeless tobacco: None  . Alcohol Use: 0.6 oz/week    1 Glasses of wine per  week  . Drug Use: No  . Sexual Activity: No   Other Topics Concern  . None   Social History Narrative  . None   Family History  Problem Relation Age of Onset  . AAA (abdominal aortic aneurysm) Mother   . Unexplained death Father   . Unexplained death Sister   . Unexplained death Brother   . Unexplained death Brother   . Unexplained death Brother   . Unexplained death Sister    Allergies  Allergen Reactions  . Avodart [Dutasteride] Other (See Comments)    Nightmares  . Flomax [Tamsulosin Hcl] Other (See Comments)    Crazy dreams   Prior to Admission medications   Medication Sig Start Date End Date Taking? Authorizing Provider  aspirin EC 81 MG tablet Take 81 mg by mouth every morning.   Yes Historical Provider, MD  cholecalciferol (VITAMIN D) 1000 UNITS tablet Take 2,000 Units by mouth daily.    Yes Historical Provider, MD  enalapril (VASOTEC) 20 MG tablet Take 20 mg by mouth daily.   Yes Historical Provider, MD  meclizine (ANTIVERT) 12.5 MG tablet Take 1 tablet (12.5 mg total) by mouth 3 (three) times daily as needed for dizziness. 04/01/13  Yes Mahima Glade LloydPandey, MD  metoprolol tartrate (LOPRESSOR) 25 MG tablet Take 12.5 mg by mouth daily.   Yes Historical Provider, MD   Dg Chest 2 View  04/11/2013   CLINICAL DATA:  FALL, PAIN  EXAM:  CHEST  2 VIEW  COMPARISON:  DG CHEST 2 VIEW dated 07/16/2003  FINDINGS: The lungs are hyperinflated likely secondary to COPD. There is no focal parenchymal opacity, pleural effusion, or pneumothorax. The heart and mediastinal contours are unremarkable.  The osseous structures are unremarkable.  IMPRESSION: No active cardiopulmonary disease.   Electronically Signed   By: Elige Ko   On: 04/11/2013 16:11   Dg Hip Complete Left  04/11/2013   CLINICAL DATA:  Pain, fall  EXAM: LEFT HIP - COMPLETE 2+ VIEW  COMPARISON:  None  FINDINGS: Diffuse osseous demineralization.  Minimal narrowing of the hip joints bilaterally.  Symmetric SI joints.  Mildly displaced  fractures of the left superior and inferior pubic rami.  No definite posterior pelvic fracture is seen.  Proximal left femur appears intact.  IMPRESSION: Displaced left superior and inferior pubic rami fractures.  Osseous demineralization.   Electronically Signed   By: Ulyses Southward M.D.   On: 04/11/2013 16:09   Ct Head Wo Contrast  04/11/2013   CLINICAL DATA:  Fall and weakness  EXAM: CT HEAD WITHOUT CONTRAST  TECHNIQUE: Contiguous axial images were obtained from the base of the skull through the vertex without intravenous contrast.  COMPARISON:  None.  FINDINGS: No skull fracture. No hemorrhage or extra-axial fluid. Moderate diffuse atrophy. Moderate low attenuation in the deep white matter. No evidence of large vascular territory infarct. Mild dilatation of the ventricles proportional to atrophy. Calcification of the carotid arteries.  IMPRESSION: Chronic involutional change with no acute findings.   Electronically Signed   By: Esperanza Heir M.D.   On: 04/11/2013 15:53   Dg Shoulder Left  04/11/2013   CLINICAL DATA:  Pain, fall  EXAM: LEFT SHOULDER - 2+ VIEW  COMPARISON:  Left humeral radiographs 07/20/2012  FINDINGS: Diffuse osseous demineralization.  AC joint alignment normal.  No acute fracture, dislocation or bone destruction.  Visualized left ribs appear intact.  IMPRESSION: No acute osseous abnormalities.   Electronically Signed   By: Ulyses Southward M.D.   On: 04/11/2013 16:10    Positive ROS: All other systems have been reviewed and were otherwise negative with the exception of those mentioned in the HPI and as above.  Physical Exam: General: Alert, no acute distress Cardiovascular: No pedal edema Respiratory: No cyanosis, no use of accessory musculature GI: No organomegaly, abdomen is soft and non-tender Skin: No lesions in the area of chief complaint Neurologic: Sensation intact distally Psychiatric: Patient is competent for consent with normal mood and affect Lymphatic: No axillary or  cervical lymphadenopathy  MUSCULOSKELETAL:  BLE - NVI - TTP of pubis   Assessment: Rami fractures  Plan: - will treat nonop - WBAT BLE - f/u 4 weeks - shoulder pain likely 2/2 contusion, no fractures, treat symptomatically  Thank you for the consult and the opportunity to see Mr. Bahe  N. Glee Arvin, MD [facility] [phone] 4:36 PM

## 2013-04-12 NOTE — Evaluation (Signed)
Physical Therapy Evaluation Patient Details Name: Renette ButtersJohn A Schwartzkopf MRN: [id number] DOB: 1919-03-17 Today's Date: 04/12/2013 Time: [id number] PT Time Calculation (min): 33 min  PT Assessment / Plan / Recommendation History of Present Illness  fracture multiple pubic rami and shoulder - for x-ray for acute event however very sore.   Clinical Impression  Pt with great difficulty with sitting balance and standing with great [posterior lean . Recommend continued PT to help get pt back to supervision/minA level in order to get back home with dtr assisting.     PT Assessment  Patient needs continued PT services    Follow Up Recommendations  SNF    Does the patient have the potential to tolerate intense rehabilitation      Barriers to Discharge        Equipment Recommendations  None recommended by PT    Recommendations for Other Services     Frequency Min 3X/week    Precautions / Restrictions Precautions Precautions: Fall Restrictions Weight Bearing Restrictions: No (PEr nurse , she got verbal clarification taht pt is WBAT , ortho did consult on pt , just no note written in chart. )   Pertinent Vitals/Pain Unrated. Pt did receive pain meds priro to our session for his shoulder he said.       Mobility  Bed Mobility Overal bed mobility: +2 for physical assistance;Needs Assistance Bed Mobility: Supine to Sit;Sit to Supine Supine to sit: Max assist;+2 for physical assistance Sit to supine: Total assist;+2 for physical assistance General bed mobility comments: Pt very stiff and on supinet o sit with great retropulsion coming to sitting. , and pt did not assist with scooting hips forward or backwards at all.  Transfers Overall transfer level: Needs assistance Equipment used: Rolling walker (2 wheeled) Transfers: Sit to/from Stand Sit to Stand: Max assist;+2 physical assistance General transfer comment: Pt with great posterior leana nd stood static at RW twice at 1 minute  intervals with mutliple physical, tactile, verbal cues to weightshift forward. Tried marching in place with great difficult, weight shifting side to side helped most however still 2 people needed at MAX A  fior posterior lean.  Ambulation/Gait Ambulation/Gait assistance:  (unable)    Exercises     PT Diagnosis: Difficulty walking;Generalized weakness  PT Problem List: Decreased strength;Decreased activity tolerance;Decreased balance;Decreased mobility;Decreased knowledge of use of DME PT Treatment Interventions: Gait training;Functional mobility training;Therapeutic activities;Therapeutic exercise;Neuromuscular re-education;Balance training;Patient/family education     PT Goals(Current goals can be found in the care plan section) Acute Rehab PT Goals Patient Stated Goal: To be up for hte super bowl tonight PT Goal Formulation: With patient/family Time For Goal Achievement: 05/03/13 Potential to Achieve Goals: Good  Visit Information  Last PT Received On: 04/12/13 Assistance Needed: +2 History of Present Illness: fracture multiple pubic rami and shoulder - for x-ray for acute event however very sore.        Prior Functioning  Home Living Family/patient expects to be discharged to:: Private residence Living Arrangements: Children (dtr caregiver for pt) Available Help at Discharge: Family;Available 24 hours/day Type of Home: House Home Access: Stairs to enter Enter[employer] CorporationEntrance Stairs-Number of Steps: 3 Entrance Stairs-Rails: Right Home Layout: One level Home Equipment: Walker - 2 wheels Prior Function Level of Independence: Independent with assistive device(s) Comments: He was using the RW to walk in the house and just recently started HHPT to begin working on balance and strength more due to he was starting to get hard to sit to stand and get feet moving  for walking. However the day he fell he had just taken 3 laps in the house to exercise with his RW. His dtr stated he did lean back  initially in sit to stand and this was gettign worse.  Communication Communication: No difficulties    Cognition  Cognition Arousal/Alertness: Awake/alert Behavior During Therapy: WFL for tasks assessed/performed Overall Cognitive Status: Within Functional Limits for tasks assessed    Extremity/Trunk Assessment Lower Extremity Assessment Lower Extremity Assessment: Generalized weakness   Balance    End of Session PT - End of Session Equipment Utilized During Treatment: Gait belt Activity Tolerance: Patient tolerated treatment well Patient left: in bed;with call bell/phone within reach;with family/visitor present Nurse Communication: Mobility status;Need for lift equipment  GP     Marella Bile 04/12/2013, 5:21 PM  Marella Bile, PT Pager: [phone] 04/12/2013

## 2013-04-12 NOTE — Progress Notes (Signed)
Patient requests all four bed rails be kept up.  Patient states he feels safe with them up. RN asked patient to use call light if he needs anything. Patient verbalized understanding. Will continue to monitor patient.  J.Janayah Zavada,RN

## 2013-04-12 NOTE — Progress Notes (Signed)
[facility] PROGRESS NOTE  Victor Buck ZOX:[id number] DOB: 17-Nov-1919 DOA: 04/11/2013 PCP: [name], MD  Assessment/Plan  Pelvic fracture, no surgery necessary -  Appreciate orthopedics assistance -  pain control  -  physical therapy  -  SW consult for probable SNF/rehab -  WBAT  L shoulder pain  - felt to have some arthritis of the left shoulder which was worsened by fall per Dr. Roda Shutters -Xray L shoulder unremarkable  -may need repeat imaging if worsens or persists  -  OT   Frequent falls/gait disorder  -needs encouragement to use walker at all times  -Pt eval   Bigeminy - Tele:  NSR with HR in 60s -  Okay to d/c telemetry -  TFTs wnl  Hypomagnesemia -  IV magnesium   Lower extremity edema -  F/u ECHO  HTN BP elevated from pain -  Control pain -  continue enalapril and metoprolol   Diet controlled DM, A1c 5.6   Diet:  Healthy heart Access:  PIV IVF:  off Proph:  lovenox  Code Status: DNR Family Communication: patient alone Disposition Plan: pending PT/OT eval, ortho eval, likely to SNF   Consultants:  Orthopedics, Dr. Roda Shutters  Procedures:  CT head  CXR  Hip XR  L shoulder XR  Antibiotics:  none   HPI/Subjective:  States he has pain in his pelvis, but was able to get to chair today to eat his meal.  Left shoulder still sore.    Objective: Filed Vitals:   04/11/13 1830 04/11/13 1856 04/11/13 2131 04/12/13 0450  BP: 194/90 155/86 123/60 131/62  Pulse: 93  80 100  Temp: 97.5 F (36.4 C)  98.2 F (36.8 C) 98.1 F (36.7 C)  TempSrc: Oral  Oral Oral  Resp: 20  20 18   Height: 5\' 9"  (1.753 m)     Weight: 71.8 kg (158 lb 4.6 oz)     SpO2: 94%  95% 96%    Intake/Output Summary (Last 24 hours) at 04/12/13 1234 Last data filed at 04/12/13 0900  Gross per 24 hour  Intake   1860 ml  Output    250 ml  Net   1610 ml   Filed Weights   04/11/13 1830  Weight: 71.8 kg (158 lb 4.6 oz)    Exam:   General:  CM, No acute  distress  HEENT:  NCAT, MMM  Cardiovascular:  RRR, nl S1, S2 no mrg, 2+ pulses, warm extremities  Respiratory:  CTAB, no increased WOB  Abdomen:   NABS, soft, NT/ND  MSK:   Normal tone and bulk, no LEE, pain with flexion/abduction of left arm  Neuro:  Grossly intact, strength 5/5 throughout  Data Reviewed: Basic Metabolic Panel:  Recent Labs Lab 04/11/13 1630 04/11/13 1800 04/12/13 0553  NA 142  --  140  K 4.1  --  3.6*  CL 104  --  105  CO2 26  --  28  GLUCOSE 136*  --  111*  BUN 26*  --  23  CREATININE 0.75  --  0.75  CALCIUM 8.9  --  7.9*  MG  --  1.7  --    Liver Function Tests: No results found for this basename: AST, ALT, ALKPHOS, BILITOT, PROT, ALBUMIN,  in the last 168 hours No results found for this basename: LIPASE, AMYLASE,  in the last 168 hours No results found for this basename: AMMONIA,  in the last 168 hours CBC:  Recent Labs Lab 04/11/13 1630 04/12/13 0553  WBC 11.5* 8.7  NEUTROABS 9.9*  --   HGB 14.7 11.9*  HCT 45.3 36.8*  MCV 90.1 90.0  PLT 132* 134*   Cardiac Enzymes: No results found for this basename: CKTOTAL, CKMB, CKMBINDEX, TROPONINI,  in the last 168 hours BNP (last 3 results) No results found for this basename: PROBNP,  in the last 8760 hours CBG: No results found for this basename: GLUCAP,  in the last 168 hours  No results found for this or any previous visit (from the past 240 hour(s)).   Studies: Dg Chest 2 View  04/11/2013   CLINICAL DATA:  FALL, PAIN  EXAM: CHEST  2 VIEW  COMPARISON:  DG CHEST 2 VIEW dated 07/16/2003  FINDINGS: The lungs are hyperinflated likely secondary to COPD. There is no focal parenchymal opacity, pleural effusion, or pneumothorax. The heart and mediastinal contours are unremarkable.  The osseous structures are unremarkable.  IMPRESSION: No active cardiopulmonary disease.   Electronically Signed   By: Elige Ko   On: 04/11/2013 16:11   Dg Hip Complete Left  04/11/2013   CLINICAL DATA:  Pain, fall   EXAM: LEFT HIP - COMPLETE 2+ VIEW  COMPARISON:  None  FINDINGS: Diffuse osseous demineralization.  Minimal narrowing of the hip joints bilaterally.  Symmetric SI joints.  Mildly displaced fractures of the left superior and inferior pubic rami.  No definite posterior pelvic fracture is seen.  Proximal left femur appears intact.  IMPRESSION: Displaced left superior and inferior pubic rami fractures.  Osseous demineralization.   Electronically Signed   By: Ulyses Southward M.D.   On: 04/11/2013 16:09   Ct Head Wo Contrast  04/11/2013   CLINICAL DATA:  Fall and weakness  EXAM: CT HEAD WITHOUT CONTRAST  TECHNIQUE: Contiguous axial images were obtained from the base of the skull through the vertex without intravenous contrast.  COMPARISON:  None.  FINDINGS: No skull fracture. No hemorrhage or extra-axial fluid. Moderate diffuse atrophy. Moderate low attenuation in the deep white matter. No evidence of large vascular territory infarct. Mild dilatation of the ventricles proportional to atrophy. Calcification of the carotid arteries.  IMPRESSION: Chronic involutional change with no acute findings.   Electronically Signed   By: Esperanza Heir M.D.   On: 04/11/2013 15:53   Dg Shoulder Left  04/11/2013   CLINICAL DATA:  Pain, fall  EXAM: LEFT SHOULDER - 2+ VIEW  COMPARISON:  Left humeral radiographs 07/20/2012  FINDINGS: Diffuse osseous demineralization.  AC joint alignment normal.  No acute fracture, dislocation or bone destruction.  Visualized left ribs appear intact.  IMPRESSION: No acute osseous abnormalities.   Electronically Signed   By: Ulyses Southward M.D.   On: 04/11/2013 16:10    Scheduled Meds: . aspirin EC  81 mg Oral q morning - 10a  . cholecalciferol  2,000 Units Oral Daily  . enalapril  20 mg Oral Daily  . enoxaparin (LOVENOX) injection  40 mg Subcutaneous Q24H  . metoprolol tartrate  12.5 mg Oral Daily   Continuous Infusions: . sodium chloride 75 mL/hr at 04/12/13 4098    Active Problems:   HTN  (hypertension)   Abnormality of gait   Type II or unspecified type diabetes mellitus without mention of complication, not stated as uncontrolled   Hypertrophy of prostate with urinary obstruction and other lower urinary tract symptoms (LUTS)   Fracture of multiple pubic rami   Pelvic fracture    Time spent: 30 min    Maxxon Schwanke, [facility]  [facility] Pager [phone]. If  7PM-7AM, please contact night-coverage at [email or web address], password [facility] 04/12/2013, 12:34 PM  LOS: 1 day

## 2013-04-13 ENCOUNTER — Inpatient Hospital Stay (HOSPITAL_COMMUNITY): Payer: Medicare Other

## 2013-04-13 DIAGNOSIS — R609 Edema, unspecified: Secondary | ICD-10-CM

## 2013-04-13 DIAGNOSIS — I498 Other specified cardiac arrhythmias: Secondary | ICD-10-CM

## 2013-04-13 DIAGNOSIS — I369 Nonrheumatic tricuspid valve disorder, unspecified: Secondary | ICD-10-CM

## 2013-04-13 LAB — CBC
HCT: 37.2 % — ABNORMAL LOW (ref 39.0–52.0)
Hemoglobin: 12.1 g/dL — ABNORMAL LOW (ref 13.0–17.0)
MCH: 29.4 pg (ref 26.0–34.0)
MCHC: 32.5 g/dL (ref 30.0–36.0)
MCV: 90.3 fL (ref 78.0–100.0)
PLATELETS: 114 10*3/uL — AB (ref 150–400)
RBC: 4.12 MIL/uL — AB (ref 4.22–5.81)
RDW: 14.4 % (ref 11.5–15.5)
WBC: 8.4 10*3/uL (ref 4.0–10.5)

## 2013-04-13 LAB — URINALYSIS, ROUTINE W REFLEX MICROSCOPIC
BILIRUBIN URINE: NEGATIVE
Glucose, UA: NEGATIVE mg/dL
Hgb urine dipstick: NEGATIVE
Ketones, ur: NEGATIVE mg/dL
NITRITE: NEGATIVE
PH: 5.5 (ref 5.0–8.0)
PROTEIN: NEGATIVE mg/dL
Specific Gravity, Urine: 1.008 (ref 1.005–1.030)
Urobilinogen, UA: 0.2 mg/dL (ref 0.0–1.0)

## 2013-04-13 LAB — BASIC METABOLIC PANEL
BUN: 16 mg/dL (ref 6–23)
CALCIUM: 8.1 mg/dL — AB (ref 8.4–10.5)
CO2: 28 mEq/L (ref 19–32)
CREATININE: 0.67 mg/dL (ref 0.50–1.35)
Chloride: 104 mEq/L (ref 96–112)
GFR calc Af Amer: >90 mL/min (ref >90)
GFR, EST NON AFRICAN AMERICAN: 81 mL/min — AB (ref >90)
GLUCOSE: 118 mg/dL — AB (ref 70–99)
Potassium: 3.9 mEq/L (ref 3.7–5.3)
SODIUM: 142 meq/L (ref 137–147)

## 2013-04-13 LAB — URINE MICROSCOPIC-ADD ON

## 2013-04-13 MED ORDER — FUROSEMIDE 10 MG/ML IJ SOLN
20.0000 mg | Freq: Once | INTRAMUSCULAR | Status: AC
  Administered 2013-04-13: 20 mg via INTRAVENOUS
  Filled 2013-04-13: qty 2

## 2013-04-13 MED ORDER — HALOPERIDOL LACTATE 5 MG/ML IJ SOLN: 1.0000 mg | Freq: Four times a day (QID) | INTRAMUSCULAR | Status: DC | PRN

## 2013-04-13 MED ORDER — METOPROLOL TARTRATE 25 MG PO TABS
25.0000 mg | ORAL_TABLET | Freq: Every day | ORAL | Status: DC
  Administered 2013-04-14 – 2013-04-15 (×2): 25 mg via ORAL
  Filled 2013-04-13 (×2): qty 1

## 2013-04-13 NOTE — Progress Notes (Signed)
Physical Therapy Treatment Patient Details Name: Victor Buck: [id number] DOB: 06/25/1919 Today's Date: 04/13/2013 Time: 1025-1050 PT Time Calculation (min): 25 min  PT Assessment / Plan / Recommendation  History of Present Illness fracture multiple pubic rami and shoulder - for x-ray for acute event however very sore.    PT Comments   Assisted pt OOB to amb with EVA walker.  Follow Up Recommendations  SNF     Does the patient have the potential to tolerate intense rehabilitation     Barriers to Discharge        Equipment Recommendations  None recommended by PT    Recommendations for Other Services    Frequency Min 3X/week   Progress towards PT Goals Progress towards PT goals: Progressing toward goals  Plan      Precautions / Restrictions Precautions Precautions: Fall Restrictions Weight Bearing Restrictions: No    Pertinent Vitals/Pain No c/o pain    Mobility  Bed Mobility Overal bed mobility: +2 for physical assistance;Needs Assistance Bed Mobility: Supine to Sit;Sit to Supine Supine to sit: Max assist;+2 for physical assistance Sit to supine: Total assist;+2 for physical assistance General bed mobility comments: increased time and assist.  Transfers Overall transfer level: Needs assistance Equipment used:  (EVA walker) Transfers: Sit to/from Stand Sit to Stand: Max assist;+2 physical assistance General transfer comment: Used EVA walker for increased support. Ambulation/Gait Ambulation/Gait assistance: +2 physical assistance;+2 safety/equipment Ambulation Distance (Feet): 25 Feet Assistive device:  (EVA walker) Gait Pattern/deviations: Step-through pattern;Narrow base of support;Shuffle Gait velocity: decreased General Gait Details: Used EVA walker for increased support.  #rd assist to follow with chair.  75% VC's to increase step length and ABD B LE's.       PT Goals (current goals can now be found in the care plan section)    Visit  Information  Last PT Received On: 04/13/13 Assistance Needed: +2 History of Present Illness: fracture multiple pubic rami and shoulder - for x-ray for acute event however very sore.     Subjective Data      Cognition       Balance     End of Session PT - End of Session Equipment Utilized During Treatment: Gait belt Activity Tolerance: Patient tolerated treatment well Patient left: in chair;with call bell/phone within reach Nurse Communication: Mobility status;Need for lift equipment   Felecia ShellingLori Becky Buck  PTA [facility]  Acute  Rehab Pager      [phone]

## 2013-04-13 NOTE — Care Management Note (Addendum)
    Page 1 of 1   04/15/2013     1:42:23 PM   CARE MANAGEMENT NOTE 04/15/2013  Patient:  Victor Buck,Victor Buck   Account Number:  [id number]  Date Initiated:  04/13/2013  Documentation initiated by:  [facility] EdgewoodMAHABIR,Ellakate Gonsalves  Subjective/Objective Assessment:   80 Y/O M ADMITTED W/FALL.PELVIC FX.     Action/Plan:   FROM HOME W/DTR.   Anticipated DC Date:  04/14/2013   Anticipated DC Plan:  SKILLED NURSING FACILITY      DC Planning Services  CM consult      Choice offered to / List presented to:             Status of service:  Completed, signed off Medicare Important Message given?  YES (If response is "NO", the following Medicare IM given date fields will be blank) Date Medicare IM given:  04/11/2013 Date Additional Medicare IM given:  04/15/2013  Discharge Disposition:  SKILLED NURSING FACILITY  Per UR Regulation:  Reviewed for med. necessity/level of care/duration of stay  If discussed at Long Length of Stay Meetings, dates discussed:    Comments:  04/15/12 Raymound Katich RN,BSN NCM [phone] NOTED FOR D/C SNF-[facility]. WAS NOT NOTIFIED YESTERDAY THAT DTR WAS HESITANT ABOUT LOOKING @ SNF.PROVIDED MEDICARE IM NOTICE TO HCPOA DTR SUSAN SINCE PATIENT UNABLE TO SIGN.DTR VOICED UNDERSTANDING.  04/14/13 Cheyanna Strick RN,BSN NCM [phone] D/C SNF.  04/13/13 Chandani Rogowski RN,BSN NCM [phone] PT-SNF.

## 2013-04-13 NOTE — Clinical Social Work Psychosocial (Signed)
     Clinical Social Work Department BRIEF PSYCHOSOCIAL ASSESSMENT 04/13/2013  Patient:  Victor Buck, Victor Buck     Account Number:  [id number]     Admit date:  04/11/2013  Clinical Social Worker:  Venia Minks  Date/Time:  04/13/2013 12:00 M  Referred by:  Physician  Date Referred:  04/13/2013 Referred for  SNF Placement   Other Referral:   Interview type:  Family Other interview type:    PSYCHOSOCIAL DATA Living Status:  FAMILY Admitted from facility:   Level of care:   Primary support name:  Marda Stalker Primary support relationship to patient:  CHILD, ADULT Degree of support available:   good    CURRENT CONCERNS Current Concerns  Post-Acute Placement   Other Concerns:    SOCIAL WORK ASSESSMENT / PLAN CSW met with patient and patient's daughter at bedside. patient was not oriented and was unable to participate in assessment. Daughter states that she has recently moved to [place] to live with patient and help take care of him. Discussed need for snf. Daughter states that she imagines he will need snf for a while as she doesnt think she can care for him in his current state. She was very concerned about payment and CSW explained insurance coverage. That seemed to ease some of her concerns. She is agreeable to CSW faxing patient out and recieving bed offers.   Assessment/plan status:   Other assessment/ plan:   Information/referral to community resources:    PATIENTS/FAMILYS RESPONSE TO PLAN OF CARE: Daughter is a bit anxious about snf placement but understands the necessity. CSW offered emotional support. will follow for snf placement.

## 2013-04-13 NOTE — Progress Notes (Signed)
Pt has been slowly getting more and more confused as his stay in this new environment lengthens.  VSS, comfort and safety measures observed.  Will continue to monitor.

## 2013-04-13 NOTE — Clinical Social Work Placement (Signed)
     Clinical Social Work Department CLINICAL SOCIAL WORK PLACEMENT NOTE 04/13/2013  Patient:  [name],Victor Buck  Account Number:  [id number] Admit date:  04/11/2013  Clinical Social Worker:  Becky SaxBETHANY Mame Twombly, LCSW  Date/time:  04/13/2013 12:00 M  Clinical Social Work is seeking post-discharge placement for this patient at the following level of care:   SKILLED NURSING   (*CSW will update this form in Epic as items are completed)   04/13/2013  Patient/family provided with [facility] of Clinical [facility] Works list of facilities offering this level of care within the geographic area requested by the patient (or if unable, by the patients family).  04/13/2013  Patient/family informed of their freedom to choose among providers that offer the needed level of care, that participate in Medicare, Medicaid or managed care program needed by the patient, have an available bed and are willing to accept the patient.  04/13/2013  Patient/family informed of [facility] ownership interest in [facility], as well as of the fact that they are under no obligation to receive care at this facility.  PASARR submitted to EDS on 04/13/2013 PASARR number received from EDS on 04/13/2013  FL2 transmitted to all facilities in geographic area requested by pt/family on  04/13/2013 FL2 transmitted to all facilities within larger geographic area on   Patient informed that his/her managed care company has contracts with or will negotiate with  certain facilities, including the following:     Patient/family informed of bed offers received:   Patient chooses bed at  Physician recommends and patient chooses bed at    Patient to be transferred to  on   Patient to be transferred to facility by   The following physician request were entered in Epic:   Additional Comments:

## 2013-04-13 NOTE — Progress Notes (Signed)
Echocardiogram 2D Echocardiogram has been performed.  Victor Buck 04/13/2013, 11:36 AM

## 2013-04-13 NOTE — Progress Notes (Signed)
[facility] PROGRESS NOTE  Victor Buck ZOX:[id number] DOB: May 26, 1919 DOA: 04/11/2013 PCP: [name], MD  Assessment/Plan  Left displaced pelvis fractures, no surgery necessary -  Appreciate orthopedics assistance -  physical therapy  -  SW consult for SNF placement -  WBAT  L shoulder pain, likely due to arthritis of the left shoulder which was worsened by fall per Dr. Roda Shutters -  Xray L shoulder without fracture -  OT  -  D/c dilaudid -  Use hydrocodone prn  Delirium with possibly some underlying dementia -  CXR and UA to screen for infection -  Lights on during day and off at night -  Haldol prn -  Minimize narcotics if possible  Acute hypoxic respiratory failure likely secondary to acute diastolic heart failure, lower extremity edema present.  ECHO with EF of 55-60%, grade 1 DD, blunted IVC compression, normal valves except for mild to mod TR -  Lasix 20mg  IV once -  Wean O2 as tolerated -  CXR to rule out pneumonia  Bigeminy, MAT with HR up to the 90s and low 100s.  -  TFTs wnl -  Continue telemetry to monitor for a-fib, however, patient already on ASA 81mg  daily and not a good candidate for more aggressive a/c given falls risk  HTN BP elevated -  Control pain -  continue enalapril -  Increase metoprolol   Diet controlled DM, A1c 5.6  Frequent falls/gait disorder  -  PT recommending SNF  Normocytic anemia, hemoglobin stable.  Defer further evaluation to PCP.    Thrombocytopenia, mild and asymptomatic, likely acute phase reactant -  Repeat CBC in AM  Diet:  Healthy heart  Access:  PIV IVF:  off Proph:  lovenox  Code Status: DNR Family Communication: patient alone Disposition Plan: pending PT/OT eval, ortho eval, likely to SNF   Consultants:  Orthopedics, Dr. Roda Shutters  Procedures:  CT head  CXR  Hip XR  L shoulder XR  Antibiotics:  none   HPI/Subjective:  Very confused and delirious this morning, not able to give history.     Objective: Filed Vitals:   04/12/13 2100 04/13/13 0445 04/13/13 1010 04/13/13 1011  BP: 149/67 156/74 155/70   Pulse: 91 92  90  Temp: 98.2 F (36.8 C) 98.5 F (36.9 C)    TempSrc: Oral Oral    Resp: 22 20    Height:      Weight:      SpO2: 97% 93%      Intake/Output Summary (Last 24 hours) at 04/13/13 1255 Last data filed at 04/13/13 0900  Gross per 24 hour  Intake   1280 ml  Output   1275 ml  Net      5 ml   Filed Weights   04/11/13 1830  Weight: 71.8 kg (158 lb 4.6 oz)    Exam:   General:  CM, No acute distress, nasal canula in place  HEENT:  NCAT, MMM  Cardiovascular:  RRR, nl S1, S2 no mrg, 2+ pulses, warm extremities  Respiratory:  No rales or rhonchi, but wheezing today, no increased WOB  Abdomen:   NABS, soft, NT/ND  MSK:   Normal tone and bulk, 1+ bilateral LEE  Neuro:  Diffuse weakness, strength limited by pain of LLE  Psych:  arouseable but sleepy, not oriented today.  confused  Data Reviewed: Basic Metabolic Panel:  Recent Labs Lab 04/11/13 1630 04/11/13 1800 04/12/13 0553 04/13/13 0504  NA 142  --  140 142  K 4.1  --  3.6* 3.9  CL 104  --  105 104  CO2 26  --  28 28  GLUCOSE 136*  --  111* 118*  BUN 26*  --  23 16  CREATININE 0.75  --  0.75 0.67  CALCIUM 8.9  --  7.9* 8.1*  MG  --  1.7  --   --    Liver Function Tests: No results found for this basename: AST, ALT, ALKPHOS, BILITOT, PROT, ALBUMIN,  in the last 168 hours No results found for this basename: LIPASE, AMYLASE,  in the last 168 hours No results found for this basename: AMMONIA,  in the last 168 hours CBC:  Recent Labs Lab 04/11/13 1630 04/12/13 0553 04/13/13 0504  WBC 11.5* 8.7 8.4  NEUTROABS 9.9*  --   --   HGB 14.7 11.9* 12.1*  HCT 45.3 36.8* 37.2*  MCV 90.1 90.0 90.3  PLT 132* 134* 114*   Cardiac Enzymes: No results found for this basename: CKTOTAL, CKMB, CKMBINDEX, TROPONINI,  in the last 168 hours BNP (last 3 results) No results found for this  basename: PROBNP,  in the last 8760 hours CBG: No results found for this basename: GLUCAP,  in the last 168 hours  No results found for this or any previous visit (from the past 240 hour(s)).   Studies: Dg Chest 2 View  04/11/2013   CLINICAL DATA:  FALL, PAIN  EXAM: CHEST  2 VIEW  COMPARISON:  DG CHEST 2 VIEW dated 07/16/2003  FINDINGS: The lungs are hyperinflated likely secondary to COPD. There is no focal parenchymal opacity, pleural effusion, or pneumothorax. The heart and mediastinal contours are unremarkable.  The osseous structures are unremarkable.  IMPRESSION: No active cardiopulmonary disease.   Electronically Signed   By: Elige KoHetal  Patel   On: 04/11/2013 16:11   Dg Hip Complete Left  04/11/2013   CLINICAL DATA:  Pain, fall  EXAM: LEFT HIP - COMPLETE 2+ VIEW  COMPARISON:  None  FINDINGS: Diffuse osseous demineralization.  Minimal narrowing of the hip joints bilaterally.  Symmetric SI joints.  Mildly displaced fractures of the left superior and inferior pubic rami.  No definite posterior pelvic fracture is seen.  Proximal left femur appears intact.  IMPRESSION: Displaced left superior and inferior pubic rami fractures.  Osseous demineralization.   Electronically Signed   By: Ulyses SouthwardMark  Boles M.D.   On: 04/11/2013 16:09   Ct Head Wo Contrast  04/11/2013   CLINICAL DATA:  Fall and weakness  EXAM: CT HEAD WITHOUT CONTRAST  TECHNIQUE: Contiguous axial images were obtained from the base of the skull through the vertex without intravenous contrast.  COMPARISON:  None.  FINDINGS: No skull fracture. No hemorrhage or extra-axial fluid. Moderate diffuse atrophy. Moderate low attenuation in the deep white matter. No evidence of large vascular territory infarct. Mild dilatation of the ventricles proportional to atrophy. Calcification of the carotid arteries.  IMPRESSION: Chronic involutional change with no acute findings.   Electronically Signed   By: Esperanza Heiraymond  Rubner M.D.   On: 04/11/2013 15:53   Dg Shoulder  Left  04/11/2013   CLINICAL DATA:  Pain, fall  EXAM: LEFT SHOULDER - 2+ VIEW  COMPARISON:  Left humeral radiographs 07/20/2012  FINDINGS: Diffuse osseous demineralization.  AC joint alignment normal.  No acute fracture, dislocation or bone destruction.  Visualized left ribs appear intact.  IMPRESSION: No acute osseous abnormalities.   Electronically Signed   By: Ulyses SouthwardMark  Boles M.D.   On: 04/11/2013 16:10  Scheduled Meds: . aspirin EC  81 mg Oral q morning - 10a  . cholecalciferol  2,000 Units Oral Daily  . enalapril  20 mg Oral Daily  . enoxaparin (LOVENOX) injection  40 mg Subcutaneous Q24H  . furosemide  20 mg Intravenous Once  . metoprolol tartrate  12.5 mg Oral Daily   Continuous Infusions: . sodium chloride 10 mL/hr (04/12/13 1329)    Active Problems:   HTN (hypertension)   Abnormality of gait   Type II or unspecified type diabetes mellitus without mention of complication, not stated as uncontrolled   Hypertrophy of prostate with urinary obstruction and other lower urinary tract symptoms (LUTS)   Fracture of multiple pubic rami   Pelvic fracture    Time spent: 30 min    Omari Mcmanaway  [facility] Pager [phone]. If 7PM-7AM, please contact night-coverage at [email or web address], password [facility] 04/13/2013, 12:55 PM  LOS: 2 days

## 2013-04-14 DIAGNOSIS — I5033 Acute on chronic diastolic (congestive) heart failure: Secondary | ICD-10-CM

## 2013-04-14 DIAGNOSIS — D696 Thrombocytopenia, unspecified: Secondary | ICD-10-CM

## 2013-04-14 DIAGNOSIS — D649 Anemia, unspecified: Secondary | ICD-10-CM

## 2013-04-14 DIAGNOSIS — F05 Delirium due to known physiological condition: Secondary | ICD-10-CM

## 2013-04-14 DIAGNOSIS — IMO0002 Reserved for concepts with insufficient information to code with codable children: Secondary | ICD-10-CM

## 2013-04-14 DIAGNOSIS — S32599A Other specified fracture of unspecified pubis, initial encounter for closed fracture: Secondary | ICD-10-CM

## 2013-04-14 LAB — CBC
HEMATOCRIT: 35.1 % — AB (ref 39.0–52.0)
Hemoglobin: 11.5 g/dL — ABNORMAL LOW (ref 13.0–17.0)
MCH: 29.3 pg (ref 26.0–34.0)
MCHC: 32.8 g/dL (ref 30.0–36.0)
MCV: 89.5 fL (ref 78.0–100.0)
Platelets: 119 10*3/uL — ABNORMAL LOW (ref 150–400)
RBC: 3.92 MIL/uL — ABNORMAL LOW (ref 4.22–5.81)
RDW: 14.4 % (ref 11.5–15.5)
WBC: 7.2 10*3/uL (ref 4.0–10.5)

## 2013-04-14 LAB — BASIC METABOLIC PANEL
BUN: 20 mg/dL (ref 6–23)
CALCIUM: 8.3 mg/dL — AB (ref 8.4–10.5)
CHLORIDE: 102 meq/L (ref 96–112)
CO2: 30 meq/L (ref 19–32)
CREATININE: 0.69 mg/dL (ref 0.50–1.35)
GFR calc Af Amer: >90 mL/min (ref >90)
GFR calc non Af Amer: 81 mL/min — ABNORMAL LOW (ref >90)
GLUCOSE: 119 mg/dL — AB (ref 70–99)
Potassium: 3.8 mEq/L (ref 3.7–5.3)
Sodium: 142 mEq/L (ref 137–147)

## 2013-04-14 MED ORDER — FUROSEMIDE 10 MG/ML IJ SOLN
20.0000 mg | Freq: Once | INTRAMUSCULAR | Status: AC
  Administered 2013-04-14: 20 mg via INTRAVENOUS
  Filled 2013-04-14: qty 2

## 2013-04-14 MED ORDER — HYDROCODONE-ACETAMINOPHEN 5-325 MG PO TABS: 1.0000 | ORAL_TABLET | ORAL | Status: AC | PRN

## 2013-04-14 MED ORDER — MUSCLE RUB 10-15 % EX CREA
TOPICAL_CREAM | CUTANEOUS | Status: DC | PRN
  Filled 2013-04-14: qty 85

## 2013-04-14 NOTE — Progress Notes (Signed)
CSW spoke with patient's daughter. She has not made a choice for snf. She states that she is not comfortable with patient being discharged until tomorrow. CSW informed about medical stability and LOS. She states that she understands and they will deal with the hospital bill if that's an issue but will not give CSW a snf choice until tomorrow AM.  Toma CopierBethany C. Klare Criss MSW, LCSW [phone]

## 2013-04-14 NOTE — Progress Notes (Signed)
CSW met with patient and daughter at bedside. Patient is alert but does not appear oriented. CSW provided bed offers to patients daughter at bedside. She was disappointed that [facility] was unable to offer a bed. She will review offers and give CSW a decision.  Victor Buck C. Hayden MSW, [place]

## 2013-04-14 NOTE — Discharge Summary (Addendum)
Physician Discharge Summary  Victor Buck ZOX:[id number] DOB: 05-14-1919 DOA: 04/11/2013  PCP: Victor Relic, MD  Admit date: 04/11/2013 Discharge date: 04/15/2013  Recommendations for Outpatient Follow-up:  1. Ongoing PT/OT 2. Please wean oxygen from 2L [place] to off as tolerated 3. Follow up with orthopedic surgery Dr. Roda Shutters in 4 weeks 4. Weight bearing as tolerated bilateral lower extremity 5. CBC in 1-2 weeks to follow up on anemia and thrombocytopenia 6. Daily weights.  From the time of admission, if gains more than three pounds in one day or 5-lbs over 7 days, please contact his primary doctor for consideration of lasix.   7. Assistance with meals please.    Discharge Diagnoses:  Principal Problem:   Pubic ramus fracture Active Problems:   HTN (hypertension)   Abnormality of gait   Type II or unspecified type diabetes mellitus without mention of complication, not stated as uncontrolled   Hypertrophy of prostate with urinary obstruction and other lower urinary tract symptoms (LUTS)   Fracture of multiple pubic rami   Contusion, shoulder /upper arm, left   Delirium due to general medical condition   Acute on chronic diastolic heart failure   Normocytic anemia   Thrombocytopenia, unspecified   Discharge Condition: stable, fair  Diet recommendation: healthy heart, 2gm sodium  Wt Readings from Last 3 Encounters:  04/11/13 71.8 kg (158 lb 4.6 oz)  04/01/13 72.122 kg (159 lb)  12/30/12 71.124 kg (156 lb 12.8 oz)    History of present illness:  Victor Buck is a 80 y.o. male with PMH of HTN, gait disorder, controlled DM, presents to the ER with the above complaint.  He has long standing gait/balance problems and is supposed to use a walker at all times which he tries to adhere to as much as possible.  He was seen by his PCP's associate 10 days ago following an episode of dizziness that lasted couple of hours, this resolved spontaneously and has not recurred, at that time  his metoprolol dose was cut in half from 25 to 12.5mg .  Today he was sitting in his chair and watching TV, when the telephone rang, he got out of the chair quickly, held his walker and turned around to get to the telephone when he stumbled and fell, subsequently started experiencing pain in his L shoulder and L hip and was brought to the ER by EMS.  He denies any dizziness, palpitations, chest pain or shortness of breath today.  In ER, noted to have Left superior and inferior pubic rami fractures and TR[facility] was consulted along with Orthopedics.   Hospital Course:  Left superior and inferior pubic rami fractures secondary to mechanic fall.  Victor Buck was evaluated by Dr. Roda Shutters who recommended WBAT and PT/OT.  He will be transferred to SNF for ongoing therapy and will follow up with orthopedic surgery in 4 weeks for reevaluation.    L shoulder pain, likely due to contusion of the upper humerus and arthritis. Xray L shoulder without fracture.  He may use hydrocodone as needed for pain and continue OT to improve mobility of the left arm.   Delirium with possibly some underlying dementia.  CT head unremarkable.  CXR without evidence of pneumonia and UA negative.  His confusion was likely due to pain medication and being in unfamiliar surroundings.  Recommend that he have lights on during the day and off at night and frequent reorientation.  Minimize sedating medications when possible.    Acute hypoxic respiratory failure likely  secondary to acute diastolic heart failure, lower extremity edema present. ECHO with EF of 55-60%, grade 1 DD, blunted IVC compression, normal valves except for mild to mod TR.  He was given two doses of lasix 20mg  IV and he diuresed gently.  He should have daily weights.  From the time of admission, if gains more than three pounds in one day or 5-lbs over 7 days, please contact his primary doctor.  CXR demonstrated some vascular congestion without overt pulmonary edema.  Please  continue to wean his nasal canula to room air as tolerated.    Bigeminy, MAT with HR up to the 90s and low 100s initially.  This may have been secondary to pain and recent fall.  He quickly resume NSR with PACs.  His ECHO is reported above and his thyroid function tests were normal.      HTN BP elevated likely secondary to pain and delirium.  He should continue enalapril and metoprolol.  Repeat blood pressure check in 1-2 weeks by primary care doctor and if BP still elevated, consider further BP medication titration.    Diet controlled DM, A1c 5.6   Frequent falls/gait disorder, likely due to deconditioning, mild autonomic dysfunction, and possibly some mild dementia.    Normocytic anemia, hemoglobin stable. Defer further evaluation to PCP.   Thrombocytopenia, mild and asymptomatic, likely acute phase reactant.  Platelets nadired at 114 on 2/2.  He should have repeat CBC in 1-2 weeks to verify resolution.     Consultants:  Orthopedics, Dr. Roda Shutters Procedures:  CT head  CXR  Hip XR  L shoulder XR Antibiotics:  none    Discharge Exam: Filed Vitals:   04/15/13 0623  BP: 149/57  Pulse: 72  Temp: 98 F (36.7 C)  Resp: 16   Filed Vitals:   04/14/13 0700 04/14/13 1358 04/14/13 2202 04/15/13 0623  BP: 144/57 140/64 146/77 149/57  Pulse:  72 117 72  Temp:  97.9 F (36.6 C) 98.1 F (36.7 C) 98 F (36.7 C)  TempSrc:  Axillary Oral Oral  Resp:  16 16 16   Height:      Weight:      SpO2:  95% 95% 91%    General: CM, No acute distress, nasal canula in place, eating breakfast with assistance HEENT: NCAT, MMM  Cardiovascular: RRR, nl S1, S2 no mrg, 2+ pulses, warm extremities  Respiratory:  No rales or rhonchi, but persistent faint wheezing today, no increased WOB  Abdomen: NABS, soft, NT/ND  MSK: Normal tone and bulk, no LEE  Neuro: Diffuse weakness, strength limited by pain of LLE  Psych: arouseable but sleepy, not oriented, confused   Discharge Instructions      Discharge  Orders   Future Appointments Provider Department Dept Phone   04/30/2013 8:30 AM [facility] [phone]   05/05/2013 1:30 PM Victor Relic, MD [facility] [phone]   Future Orders Complete By Expires   (HEART FAILURE PATIENTS) Call MD:  Anytime you have any of the following symptoms: 1) 3 pound weight gain in 24 hours or 5 pounds in 1 week 2) shortness of breath, with or without a dry hacking cough 3) swelling in the hands, feet or stomach 4) if you have to sleep on extra pillows at night in order to breathe.  As directed    Call MD for:  difficulty breathing, headache or visual disturbances  As directed    Call MD for:  extreme fatigue  As directed  Call MD for:  hives  As directed    Call MD for:  persistant dizziness or light-headedness  As directed    Call MD for:  persistant nausea and vomiting  As directed    Call MD for:  severe uncontrolled pain  As directed    Call MD for:  temperature >100.4  As directed    Diet - low sodium heart healthy  As directed    Increase activity slowly  As directed        Medication List         aspirin EC 81 MG tablet  Take 81 mg by mouth every morning.     cholecalciferol 1000 UNITS tablet  Commonly known as:  VITAMIN D  Take 2,000 Units by mouth daily.     enalapril 20 MG tablet  Commonly known as:  VASOTEC  Take 20 mg by mouth daily.     HYDROcodone-acetaminophen 5-325 MG per tablet  Commonly known as:  NORCO/VICODIN  Take 1 tablet by mouth every 4 (four) hours as needed for moderate pain.     meclizine 12.5 MG tablet  Commonly known as:  ANTIVERT  Take 1 tablet (12.5 mg total) by mouth 3 (three) times daily as needed for dizziness.     metoprolol tartrate 25 MG tablet  Commonly known as:  LOPRESSOR  Take 12.5 mg by mouth daily.     neomycin-polymyxin b-dexamethasone 3.5-10000-0.1 Susp  Commonly known as:  MAXITROL  Place 2 drops into both eyes every 4 (four) hours.       Follow-up  Information   Follow up with GREEN, Lenon CurtARTHUR G, MD. Schedule an appointment as soon as possible for a visit in 2 weeks.   Specialty:  Internal Medicine   Contact information:   [address] [place] [address] [phone]        The results of significant diagnostics from this hospitalization (including imaging, microbiology, ancillary and laboratory) are listed below for reference.    Significant Diagnostic Studies: Dg Chest 2 View  04/11/2013   CLINICAL DATA:  FALL, PAIN  EXAM: CHEST  2 VIEW  COMPARISON:  DG CHEST 2 VIEW dated 07/16/2003  FINDINGS: The lungs are hyperinflated likely secondary to COPD. There is no focal parenchymal opacity, pleural effusion, or pneumothorax. The heart and mediastinal contours are unremarkable.  The osseous structures are unremarkable.  IMPRESSION: No active cardiopulmonary disease.   Electronically Signed   By: Elige KoHetal  Patel   On: 04/11/2013 16:11   Dg Hip Complete Left  04/11/2013   CLINICAL DATA:  Pain, fall  EXAM: LEFT HIP - COMPLETE 2+ VIEW  COMPARISON:  None  FINDINGS: Diffuse osseous demineralization.  Minimal narrowing of the hip joints bilaterally.  Symmetric SI joints.  Mildly displaced fractures of the left superior and inferior pubic rami.  No definite posterior pelvic fracture is seen.  Proximal left femur appears intact.  IMPRESSION: Displaced left superior and inferior pubic rami fractures.  Osseous demineralization.   Electronically Signed   By: Ulyses SouthwardMark  Boles M.D.   On: 04/11/2013 16:09   Ct Head Wo Contrast  04/11/2013   CLINICAL DATA:  Fall and weakness  EXAM: CT HEAD WITHOUT CONTRAST  TECHNIQUE: Contiguous axial images were obtained from the base of the skull through the vertex without intravenous contrast.  COMPARISON:  None.  FINDINGS: No skull fracture. No hemorrhage or extra-axial fluid. Moderate diffuse atrophy. Moderate low attenuation in the deep white matter. No evidence of large vascular territory infarct.  Mild dilatation of the  ventricles proportional to atrophy. Calcification of the carotid arteries.  IMPRESSION: Chronic involutional change with no acute findings.   Electronically Signed   By: Esperanza Heir M.D.   On: 04/11/2013 15:53   Dg Chest Port 1 View  04/13/2013   CLINICAL DATA:  80 year old male with wheezing. Hypoxia. Initial encounter.  EXAM: PORTABLE CHEST - 1 VIEW  COMPARISON:  04/11/2013 and earlier.  FINDINGS: Portable AP semi upright view at 1329 hrs. Stable lung volumes. Stable cardiac size and mediastinal contours. No pneumothorax. Mildly increased pulmonary vascularity but no overt edema. No definite pleural effusion. No consolidation.  IMPRESSION: Mildly increased pulmonary vascular congestion but no overt pulmonary edema.   Electronically Signed   By: Augusto Gamble M.D.   On: 04/13/2013 14:09   Dg Shoulder Left  04/11/2013   CLINICAL DATA:  Pain, fall  EXAM: LEFT SHOULDER - 2+ VIEW  COMPARISON:  Left humeral radiographs 07/20/2012  FINDINGS: Diffuse osseous demineralization.  AC joint alignment normal.  No acute fracture, dislocation or bone destruction.  Visualized left ribs appear intact.  IMPRESSION: No acute osseous abnormalities.   Electronically Signed   By: Ulyses Southward M.D.   On: 04/11/2013 16:10    Microbiology: No results found for this or any previous visit (from the past 240 hour(s)).   Labs: Basic Metabolic Panel:  Recent Labs Lab 04/11/13 1630 04/11/13 1800 04/12/13 0553 04/13/13 0504 04/14/13 0454  NA 142  --  140 142 142  K 4.1  --  3.6* 3.9 3.8  CL 104  --  105 104 102  CO2 26  --  28 28 30   GLUCOSE 136*  --  111* 118* 119*  BUN 26*  --  23 16 20   CREATININE 0.75  --  0.75 0.67 0.69  CALCIUM 8.9  --  7.9* 8.1* 8.3*  MG  --  1.7  --   --   --    Liver Function Tests: No results found for this basename: AST, ALT, ALKPHOS, BILITOT, PROT, ALBUMIN,  in the last 168 hours No results found for this basename: LIPASE, AMYLASE,  in the last 168 hours No results found for this  basename: AMMONIA,  in the last 168 hours CBC:  Recent Labs Lab 04/11/13 1630 04/12/13 0553 04/13/13 0504 04/14/13 0454  WBC 11.5* 8.7 8.4 7.2  NEUTROABS 9.9*  --   --   --   HGB 14.7 11.9* 12.1* 11.5*  HCT 45.3 36.8* 37.2* 35.1*  MCV 90.1 90.0 90.3 89.5  PLT 132* 134* 114* 119*   Cardiac Enzymes: No results found for this basename: CKTOTAL, CKMB, CKMBINDEX, TROPONINI,  in the last 168 hours BNP: BNP (last 3 results) No results found for this basename: PROBNP,  in the last 8760 hours CBG: No results found for this basename: GLUCAP,  in the last 168 hours  Time coordinating discharge: 45 minutes  Signed:  Jireh Elmore K  [facility] 04/15/2013, 11:03 AM

## 2013-04-15 DIAGNOSIS — R42 Dizziness and giddiness: Secondary | ICD-10-CM

## 2013-04-15 DIAGNOSIS — F05 Delirium due to known physiological condition: Secondary | ICD-10-CM

## 2013-04-15 MED ORDER — NEOMYCIN-POLYMYXIN-DEXAMETH 3.5-10000-0.1 OP SUSP
2.0000 [drp] | OPHTHALMIC | Status: DC
  Administered 2013-04-15: 2 [drp] via OPHTHALMIC
  Filled 2013-04-15: qty 5

## 2013-04-15 MED ORDER — NEOMYCIN-POLYMYXIN-DEXAMETH 3.5-10000-0.1 OP SUSP: 2.0000 [drp] | OPHTHALMIC | Status: AC

## 2013-04-15 NOTE — Progress Notes (Addendum)
[facility] able [facility] accept patient today. Packet copied and placed in [place]. [facility]tar called for transportation. CSW met with patient and daughters at bedside. They understand that there is no guarantee of payment. They are agreeable to discharge now and are less anxious now that they have chosen a facility.  Acacia Latorre C. Clark MSW, [place]

## 2013-04-15 NOTE — Progress Notes (Signed)
Clinical Social Work Department CLINICAL SOCIAL WORK PLACEMENT NOTE 04/15/2013  Patient:  Victor Buck,Victor Buck  Account Number:  [id number] Admit date:  04/11/2013  Clinical Social Worker:  Becky SaxBETHANY Jania Steinke, LCSW  Date/time:  04/13/2013 12:00 M  Clinical Social Work is seeking post-discharge placement for this patient at the following level of care:   SKILLED NURSING   (*CSW will update this form in Epic as items are completed)   04/13/2013  Patient/family provided with [facility] Department of Clinical Social Work's list of facilities offering this level of care within the geographic area requested by the patient (or if unable, by the patient's family).  04/13/2013  Patient/family informed of their freedom to choose among providers that offer the needed level of care, that participate in Medicare, Medicaid or managed care program needed by the patient, have an available bed and are willing to accept the patient.  04/13/2013  Patient/family informed of [facility]' ownership interest in [facility], as well as of the fact that they are under no obligation to receive care at this facility.  PASARR submitted to EDS on 04/13/2013 PASARR number received from EDS on 04/13/2013  FL2 transmitted to all facilities in geographic area requested by pt/family on  04/13/2013 FL2 transmitted to all facilities within larger geographic area on   Patient informed that his/her managed care company has contracts with or will negotiate with  certain facilities, including the following:     Patient/family informed of bed offers received:  04/15/2013 [facility] Physician recommends and patient chooses bed at    [facility] on  04/15/2013 Patient to be transferred to facility by ptar  The following physician request were entered in Epic:   Additional Comments:

## 2013-04-15 NOTE — Progress Notes (Signed)
[facility] PROGRESS NOTE  Sankalp Ferrell Fake ZOX:[id number] DOB: [birth year] DOA: 04/11/2013 PCP: [name], MD  Assessment/Plan: Left superior and inferior pubic rami fractures secondary to mechanic fall. Mr. Ingwersen was evaluated by Dr. Roda Shutters who recommended WBAT and PT/OT. He will be transferred to SNF for ongoing therapy and will follow up with orthopedic surgery in 4 weeks for reevaluation.  L shoulder pain, likely due to contusion of the upper humerus and arthritis. Xray L shoulder without fracture. He may use hydrocodone as needed for pain and continue OT to improve mobility of the left arm.  Delirium with possibly some underlying dementia. CT head unremarkable. CXR without evidence of pneumonia and UA negative. His confusion was likely due to pain medication and being in unfamiliar surroundings. Recommend that he have lights on during the day and off at night and frequent reorientation. Minimize sedating medications when possible.  Acute hypoxic respiratory failure likely secondary to acute diastolic heart failure, lower extremity edema present. ECHO with EF of 55-60%, grade 1 DD, blunted IVC compression, normal valves except for mild to mod TR. He was given two doses of lasix 20mg  IV and he diuresed gently. He should have daily weights. From the time of admission, if gains more than three pounds in one day or 5-lbs over 7 days, please contact his primary doctor. CXR demonstrated some vascular congestion without overt pulmonary edema. Please continue to wean his nasal canula to room air as tolerated.  Bigeminy, MAT with HR up to the 90s and low 100s initially. This may have been secondary to pain and recent fall. He quickly resume NSR with PACs. His ECHO is reported above and his thyroid function tests were normal.  HTN BP elevated likely secondary to pain and delirium. He should continue enalapril and metoprolol. Repeat blood pressure check in 1-2 weeks by primary care doctor and if  BP still elevated, consider further BP medication titration.  Diet controlled DM, A1c 5.6  Frequent falls/gait disorder, likely due to deconditioning, mild autonomic dysfunction, and possibly some mild dementia.  Normocytic anemia, hemoglobin stable. Defer further evaluation to PCP.  Thrombocytopenia, mild and asymptomatic, likely acute phase reactant. Platelets nadired at 114 on 2/2. He should have repeat CBC in 1-2 weeks to verify resolution.    Code Status: DNR Family Communication: Pt in room (indicate person spoken with, relationship, and if by phone, the number) Disposition Plan: Pending SNF   Consultants:  Orthopedic surgery  HPI/Subjective: No acute events noted overnight  Objective: Filed Vitals:   04/14/13 0700 04/14/13 1358 04/14/13 2202 04/15/13 0623  BP: 144/57 140/64 146/77 149/57  Pulse:  72 117 72  Temp:  97.9 F (36.6 C) 98.1 F (36.7 C) 98 F (36.7 C)  TempSrc:  Axillary Oral Oral  Resp:  16 16 16   Height:      Weight:      SpO2:  95% 95% 91%    Intake/Output Summary (Last 24 hours) at 04/15/13 0850 Last data filed at 04/15/13 0600  Gross per 24 hour  Intake    590 ml  Output   1050 ml  Net   -460 ml   Filed Weights   04/11/13 1830  Weight: 71.8 kg (158 lb 4.6 oz)    Exam:   General:  Awake, in nad  Cardiovascular: regular, s1, s2  Respiratory: normal resp effort, no wheezing  Abdomen: soft, nondistended  Musculoskeletal: perfused, no clubbing   Data Reviewed: Basic Metabolic Panel:  Recent Labs Lab 04/11/13 1630  04/11/13 1800 04/12/13 0553 04/13/13 0504 04/14/13 0454  NA 142  --  140 142 142  K 4.1  --  3.6* 3.9 3.8  CL 104  --  105 104 102  CO2 26  --  28 28 30   GLUCOSE 136*  --  111* 118* 119*  BUN 26*  --  23 16 20   CREATININE 0.75  --  0.75 0.67 0.69  CALCIUM 8.9  --  7.9* 8.1* 8.3*  MG  --  1.7  --   --   --    Liver Function Tests: No results found for this basename: AST, ALT, ALKPHOS, BILITOT, PROT, ALBUMIN,   in the last 168 hours No results found for this basename: LIPASE, AMYLASE,  in the last 168 hours No results found for this basename: AMMONIA,  in the last 168 hours CBC:  Recent Labs Lab 04/11/13 1630 04/12/13 0553 04/13/13 0504 04/14/13 0454  WBC 11.5* 8.7 8.4 7.2  NEUTROABS 9.9*  --   --   --   HGB 14.7 11.9* 12.1* 11.5*  HCT 45.3 36.8* 37.2* 35.1*  MCV 90.1 90.0 90.3 89.5  PLT 132* 134* 114* 119*   Cardiac Enzymes: No results found for this basename: CKTOTAL, CKMB, CKMBINDEX, TROPONINI,  in the last 168 hours BNP (last 3 results) No results found for this basename: PROBNP,  in the last 8760 hours CBG: No results found for this basename: GLUCAP,  in the last 168 hours  No results found for this or any previous visit (from the past 240 hour(s)).   Studies: Dg Chest Port 1 View  04/13/2013   CLINICAL DATA:  80 year old male with wheezing. Hypoxia. Initial encounter.  EXAM: PORTABLE CHEST - 1 VIEW  COMPARISON:  04/11/2013 and earlier.  FINDINGS: Portable AP semi upright view at 1329 hrs. Stable lung volumes. Stable cardiac size and mediastinal contours. No pneumothorax. Mildly increased pulmonary vascularity but no overt edema. No definite pleural effusion. No consolidation.  IMPRESSION: Mildly increased pulmonary vascular congestion but no overt pulmonary edema.   Electronically Signed   By: Augusto GambleLee  Hall M.D.   On: 04/13/2013 14:09    Scheduled Meds: . aspirin EC  81 mg Oral q morning - 10a  . cholecalciferol  2,000 Units Oral Daily  . enalapril  20 mg Oral Daily  . enoxaparin (LOVENOX) injection  40 mg Subcutaneous Q24H  . metoprolol tartrate  25 mg Oral Daily   Continuous Infusions: . sodium chloride 10 mL/hr (04/12/13 1329)    Principal Problem:   Pubic ramus fracture Active Problems:   HTN (hypertension)   Abnormality of gait   Type II or unspecified type diabetes mellitus without mention of complication, not stated as uncontrolled   Hypertrophy of prostate with  urinary obstruction and other lower urinary tract symptoms (LUTS)   Fracture of multiple pubic rami   Contusion, shoulder /upper arm, left   Delirium due to general medical condition   Acute on chronic diastolic heart failure   Normocytic anemia   Thrombocytopenia, unspecified  Time spent: 35min  Tyreece Gelles K  [facility] Pager [phone]. If 7PM-7AM, please contact night-coverage at [email or web address], password [facility] 04/15/2013, 8:50 AM  LOS: 4 days

## 2013-04-15 NOTE — Progress Notes (Signed)
Pt's BP is 98/38 manually, HR 61. Pt resting and asymptomatic. Pt given one vicodin about two hours ago for pain. MD made aware and no new orders received and pt to proceed with discharge plan for today. Also [facility] and given to the Nurse Administrator, Sam. Pt ready for discharge and awaiting transport.   Arta BruceDeutsch, Kemon Devincenzi [facility] 04/15/2013

## 2013-04-15 NOTE — Progress Notes (Signed)
CSW met with patient and daughters at bedside. Daughter is upset as she feels like yesterday that the doctor told her it would be a couple of days and they were caught off guard when CSW told them patient was discharged yesterday. They asked what happens if they cant find a facility today and patient needs to stay another day. CSW explained that patient would then need to be discharged home and they could place him from home as patient has been cleared for discharge since yesterday. They were very upset by this news. They state that they have it narrowed down to two facilities. CSW encouraged them to go tour and please let CSW know which facility by lunchtime. They understand.  Damaris Geers C. Proctor MSW, [place]

## 2013-04-30 ENCOUNTER — Other Ambulatory Visit: Payer: Self-pay

## 2013-05-05 ENCOUNTER — Ambulatory Visit: Payer: Self-pay | Admitting: Internal Medicine

## 2014-08-10 IMAGING — CT CT HEAD W/O CM
2 series · 17 of 30 positions shown, 20 images · non-contrast
Comparison: None.

CLINICAL DATA: Fall and weakness

EXAM:
CT HEAD WITHOUT CONTRAST
TECHNIQUE: Contiguous axial images were obtained from the base of the skull
through the vertex without intravenous contrast.

[Series 2: head w/o · axial · non-contrast · 0.42mm/px · z∈[+1080,+1195]mm · 9 of 29 slices shown, 12 images]
[im 3/29  brain]
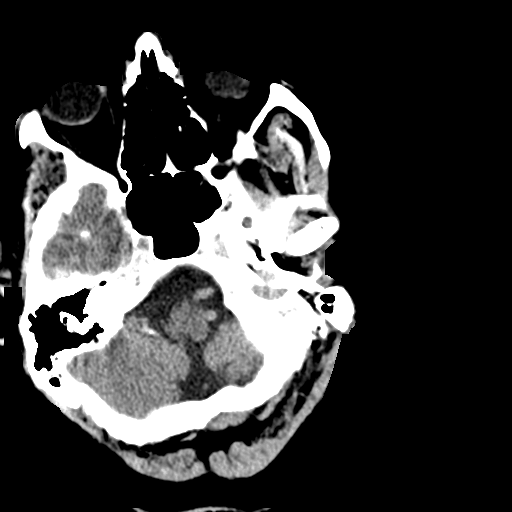
[im 3/29  bone]
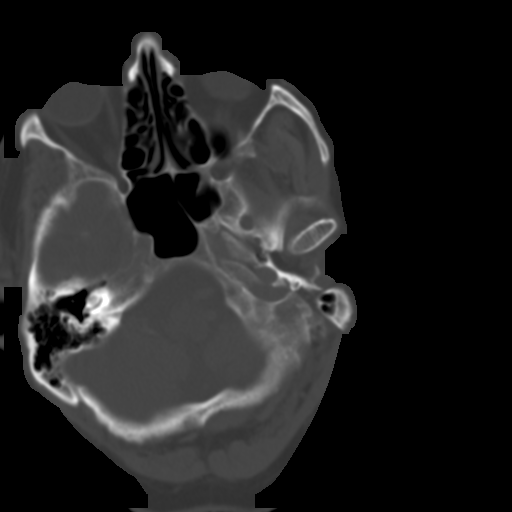
[im 6/29  brain]
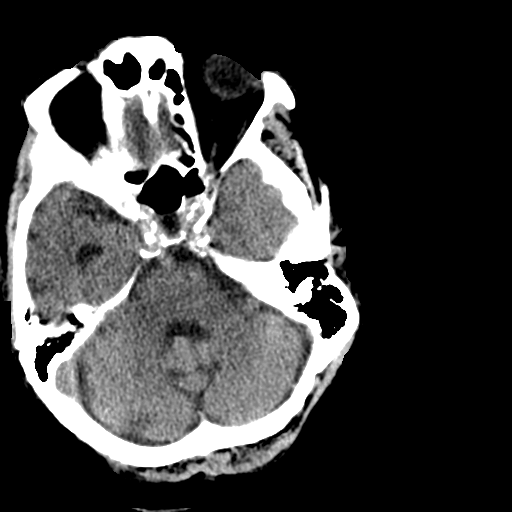
[im 9/29  brain]
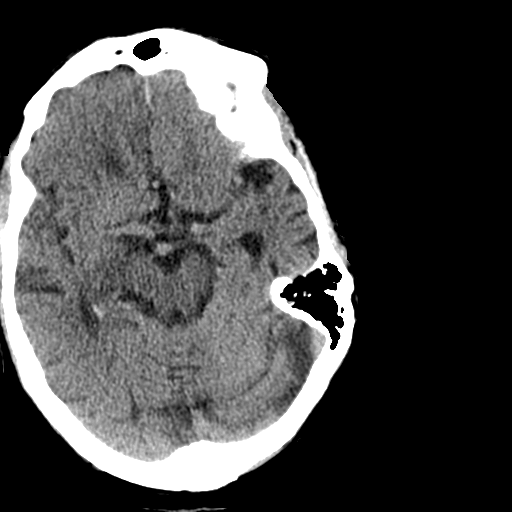
[im 12/29  brain]
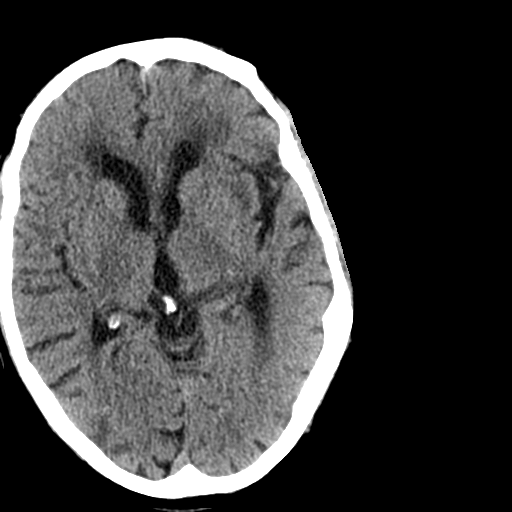
[im 15/29  brain]
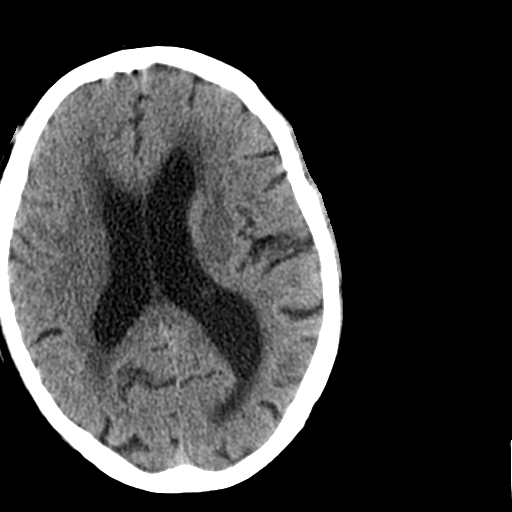
[im 15/29  bone]
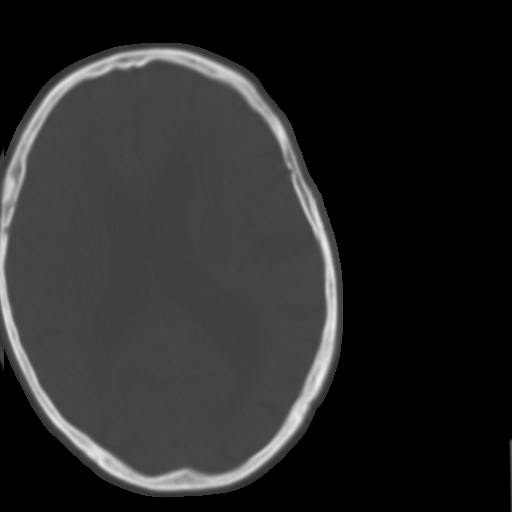
[im 17/29  brain]
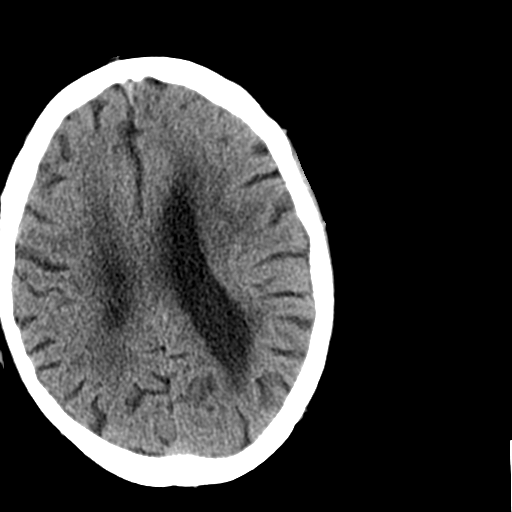
[im 20/29  brain]
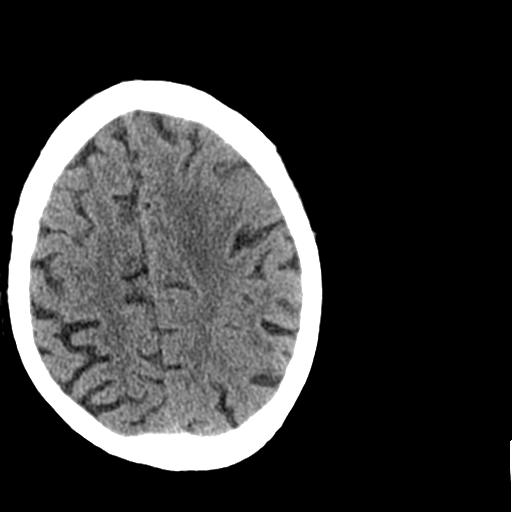
[im 23/29  brain]
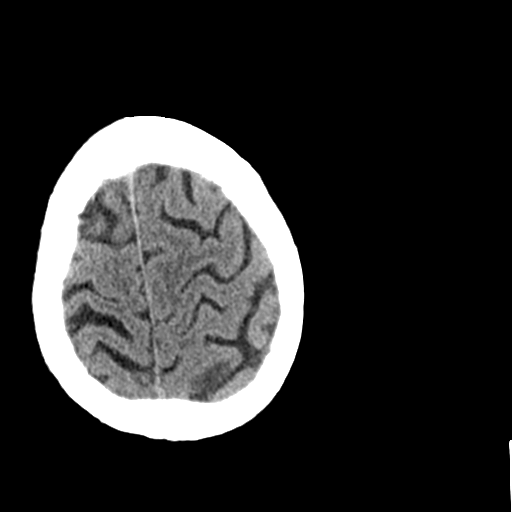
[im 26/29  brain]
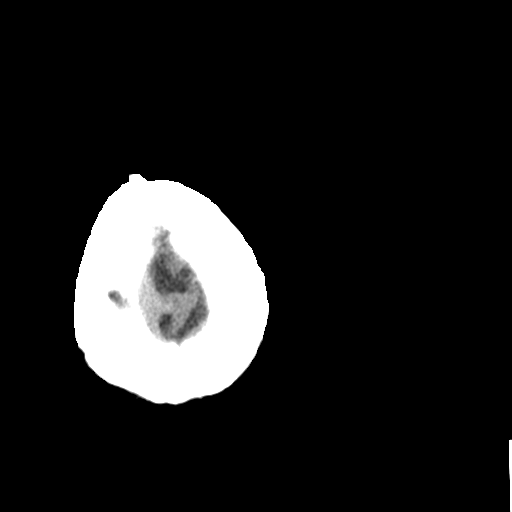
[im 26/29  bone]
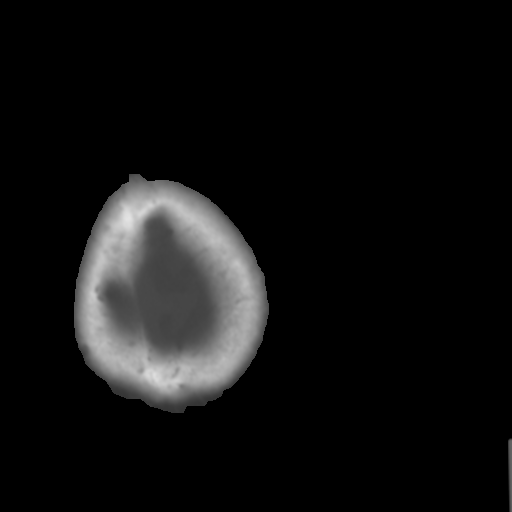

[Series 3: bone windows · axial · 0.42mm/px · z∈[+1085,+1196]mm · 8 of 49 slices shown]
[im 6/49  bone]
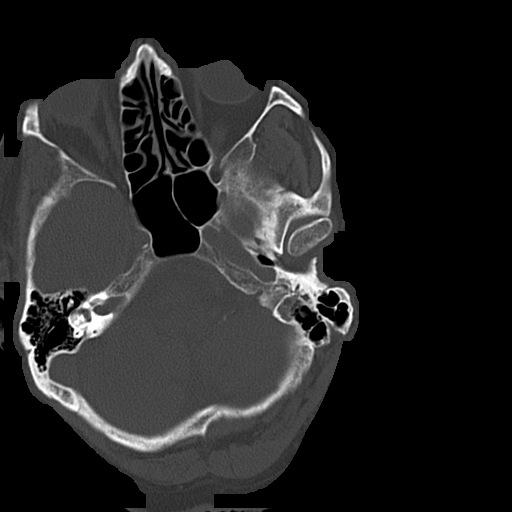
[im 11/49  bone]
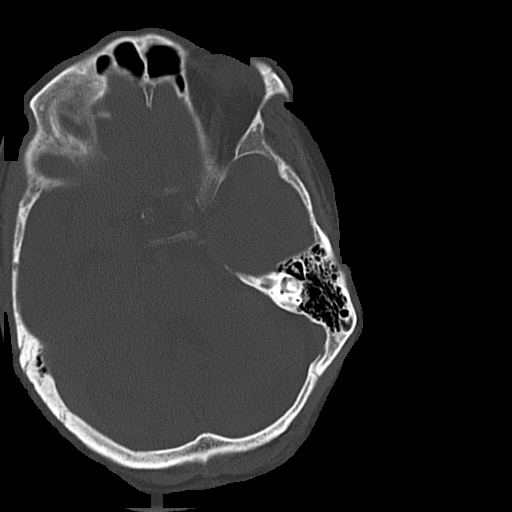
[im 17/49  bone]
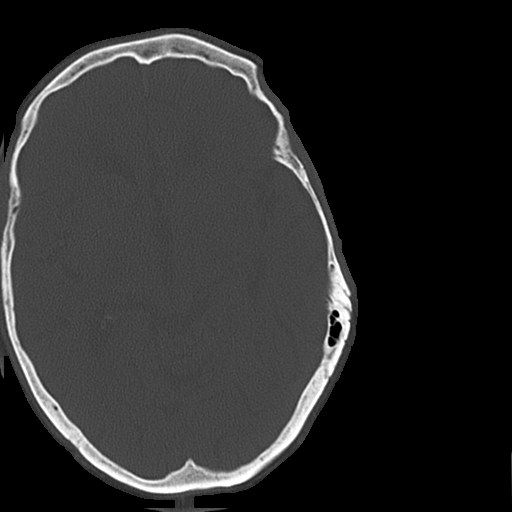
[im 22/49  bone]
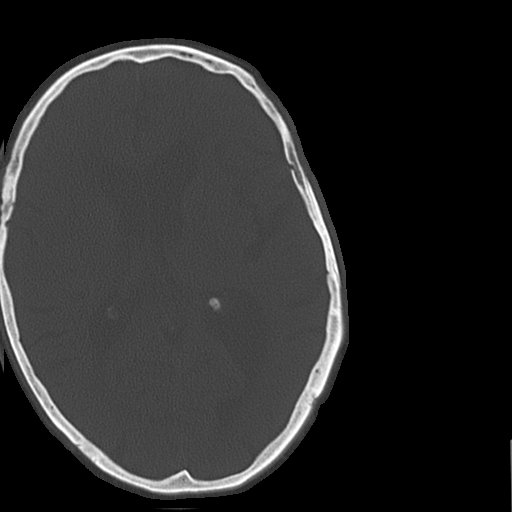
[im 27/49  bone]
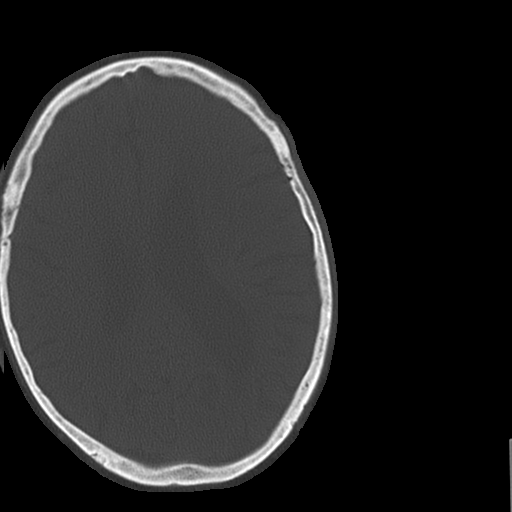
[im 33/49  bone]
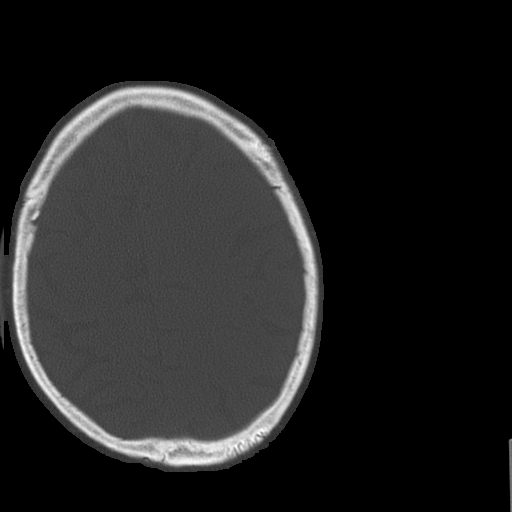
[im 38/49  bone]
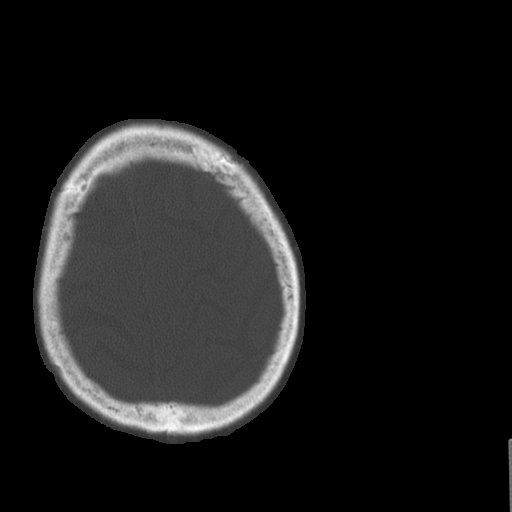
[im 43/49  bone]
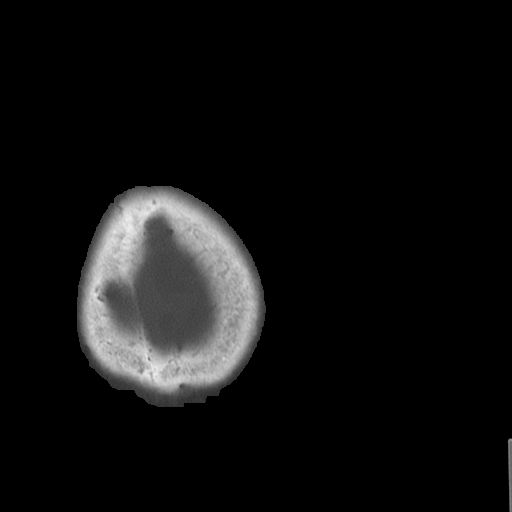

[17 of 30 positions shown; findings below may reference images not displayed]

FINDINGS: No skull fracture. No hemorrhage or extra-axial fluid. Moderate
diffuse atrophy. Moderate low attenuation in the deep white matter.
No evidence of large vascular territory infarct. Mild dilatation of
the ventricles proportional to atrophy. Calcification of the carotid
arteries.
IMPRESSION: Chronic involutional change with no acute findings.

## 2014-08-10 IMAGING — CR DG CHEST 2V
3 series · 3 of 3 positions shown · non-contrast
Comparison: DG CHEST 2 VIEW dated 07/16/2003

CLINICAL DATA: FALL, PAIN

EXAM:
CHEST  2 VIEW

[w chest lat]
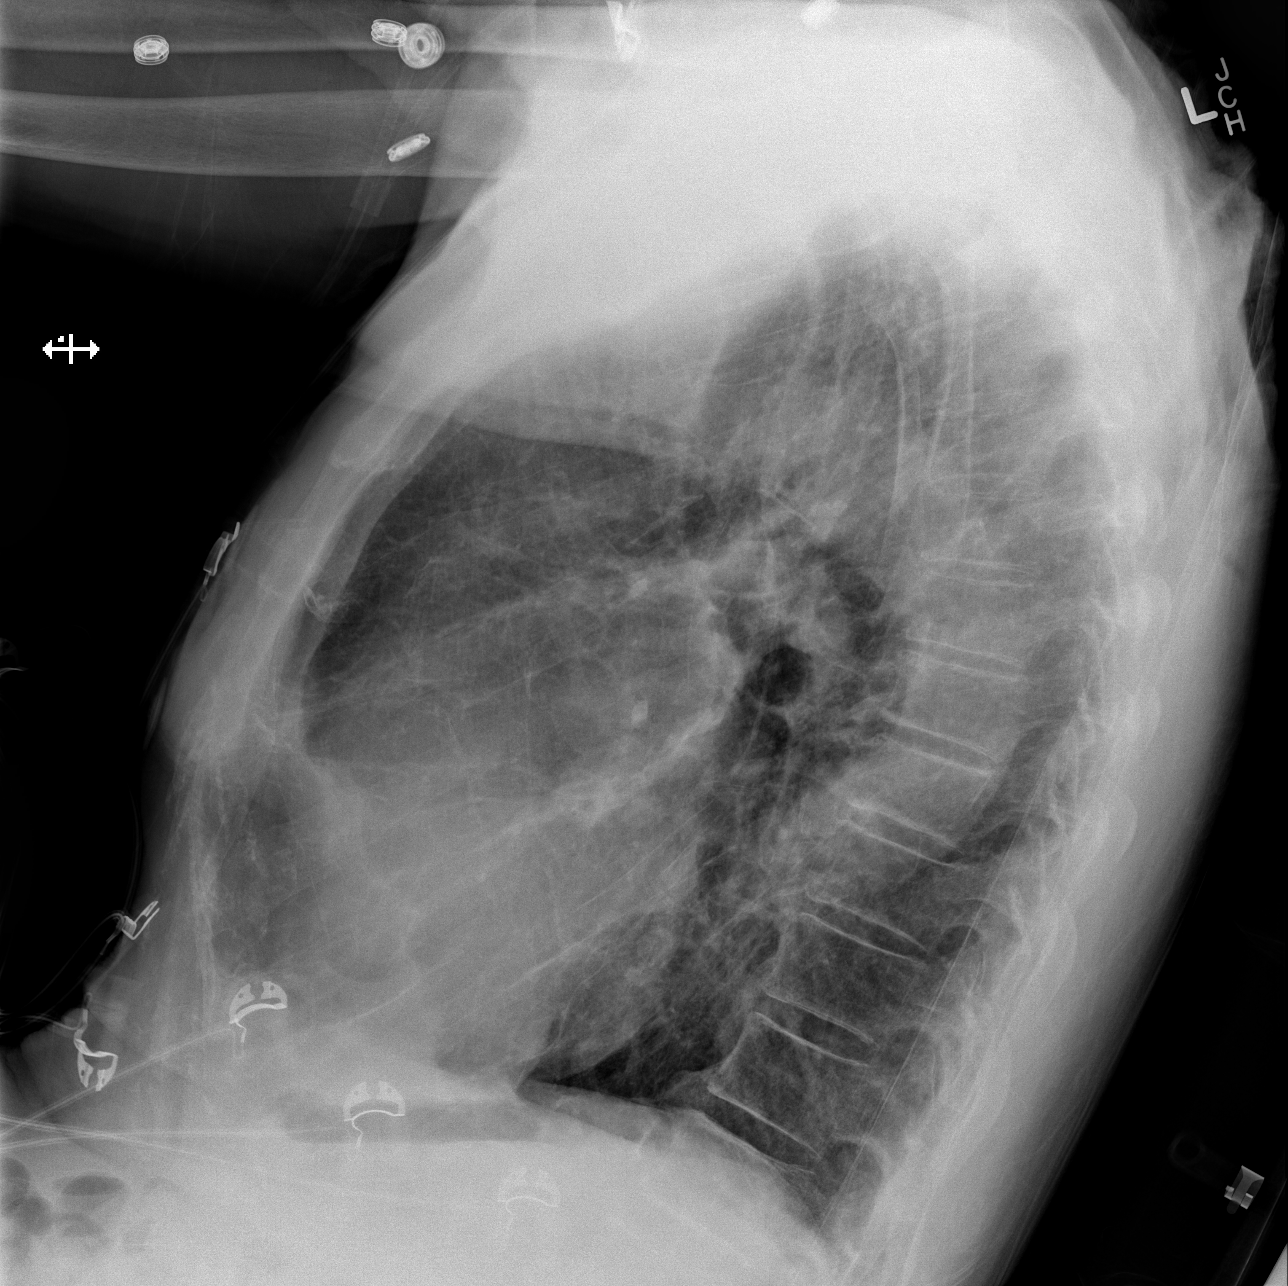

[x chest ap (1 of 2)]
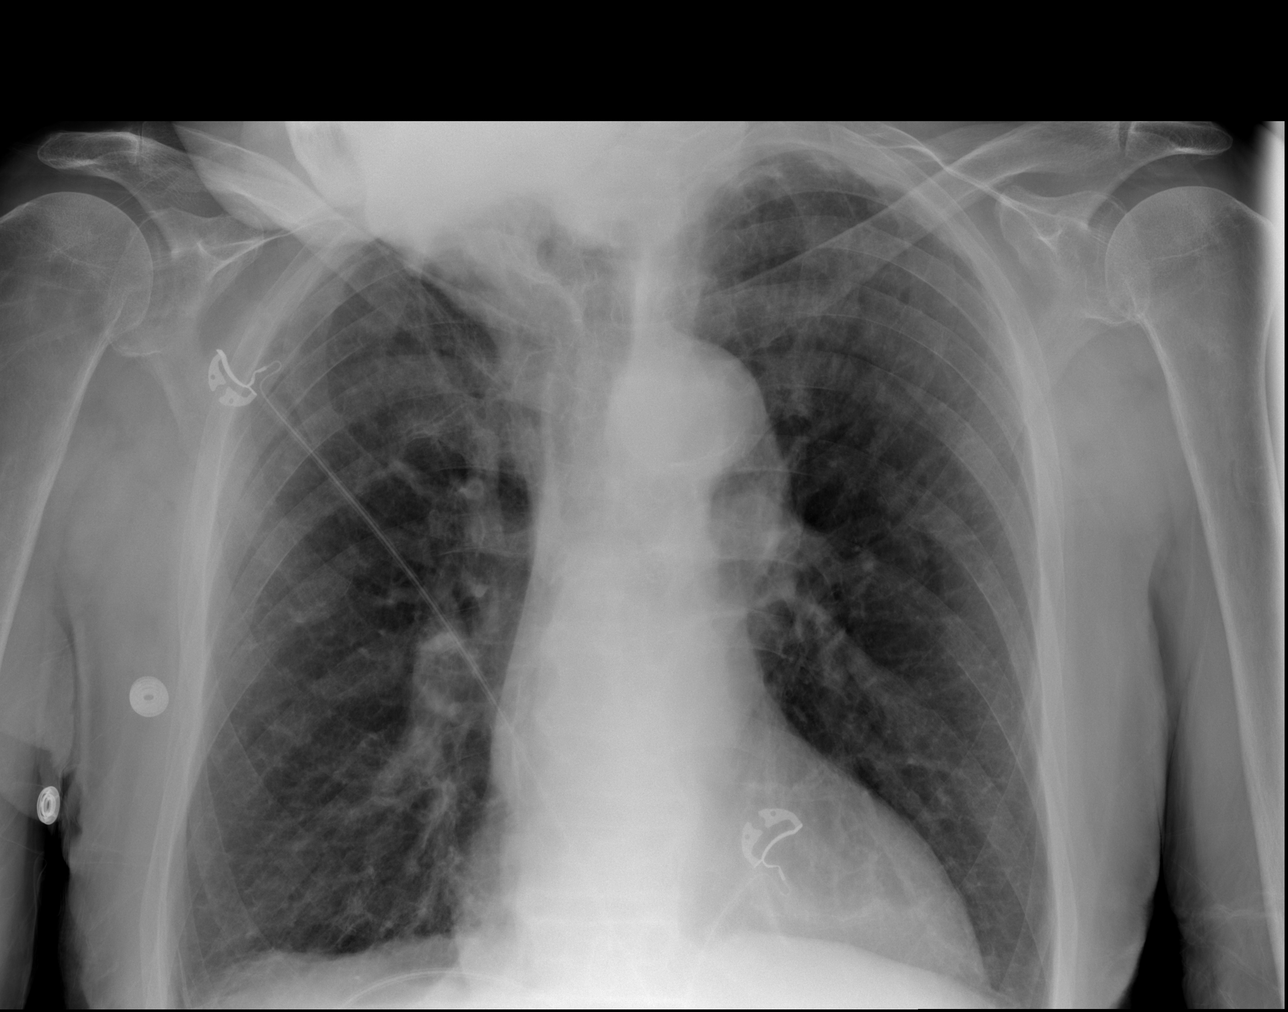

[x chest ap (2 of 2)]
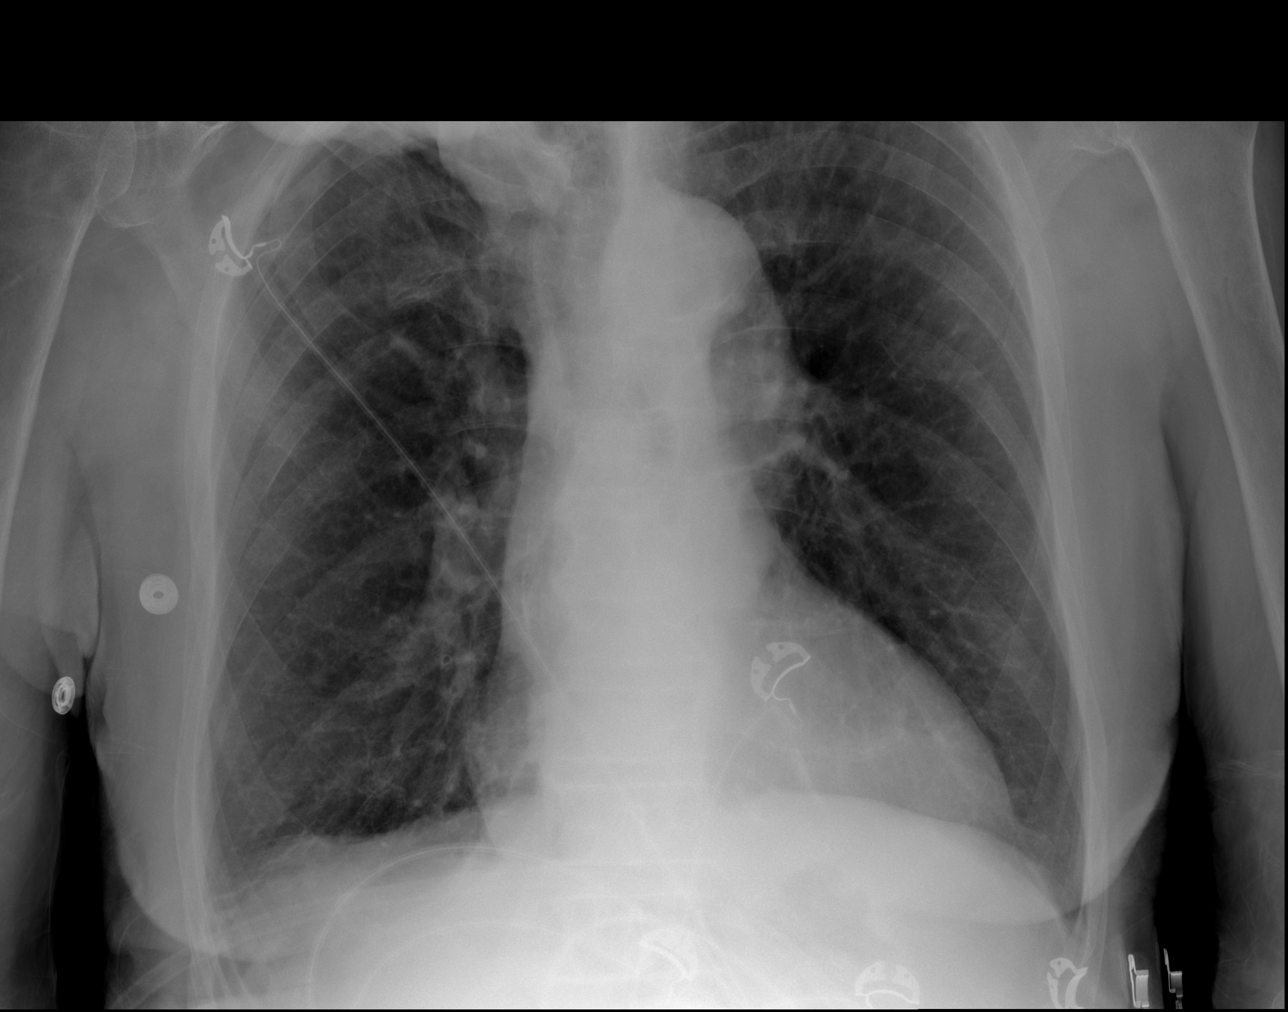

[3 of 3 positions shown; findings below may reference images not displayed]

FINDINGS: The lungs are hyperinflated likely secondary to COPD. There is no
focal parenchymal opacity, pleural effusion, or pneumothorax. The
heart and mediastinal contours are unremarkable.

The osseous structures are unremarkable.
IMPRESSION: No active cardiopulmonary disease.

## 2014-08-12 IMAGING — CR DG CHEST 1V PORT
1 series · 1 of 1 positions shown · non-contrast
Comparison: 04/11/2013 and earlier.

CLINICAL DATA: [AGE] male with wheezing. Hypoxia. Initial
encounter.

EXAM:
PORTABLE CHEST - 1 VIEW

[AP]
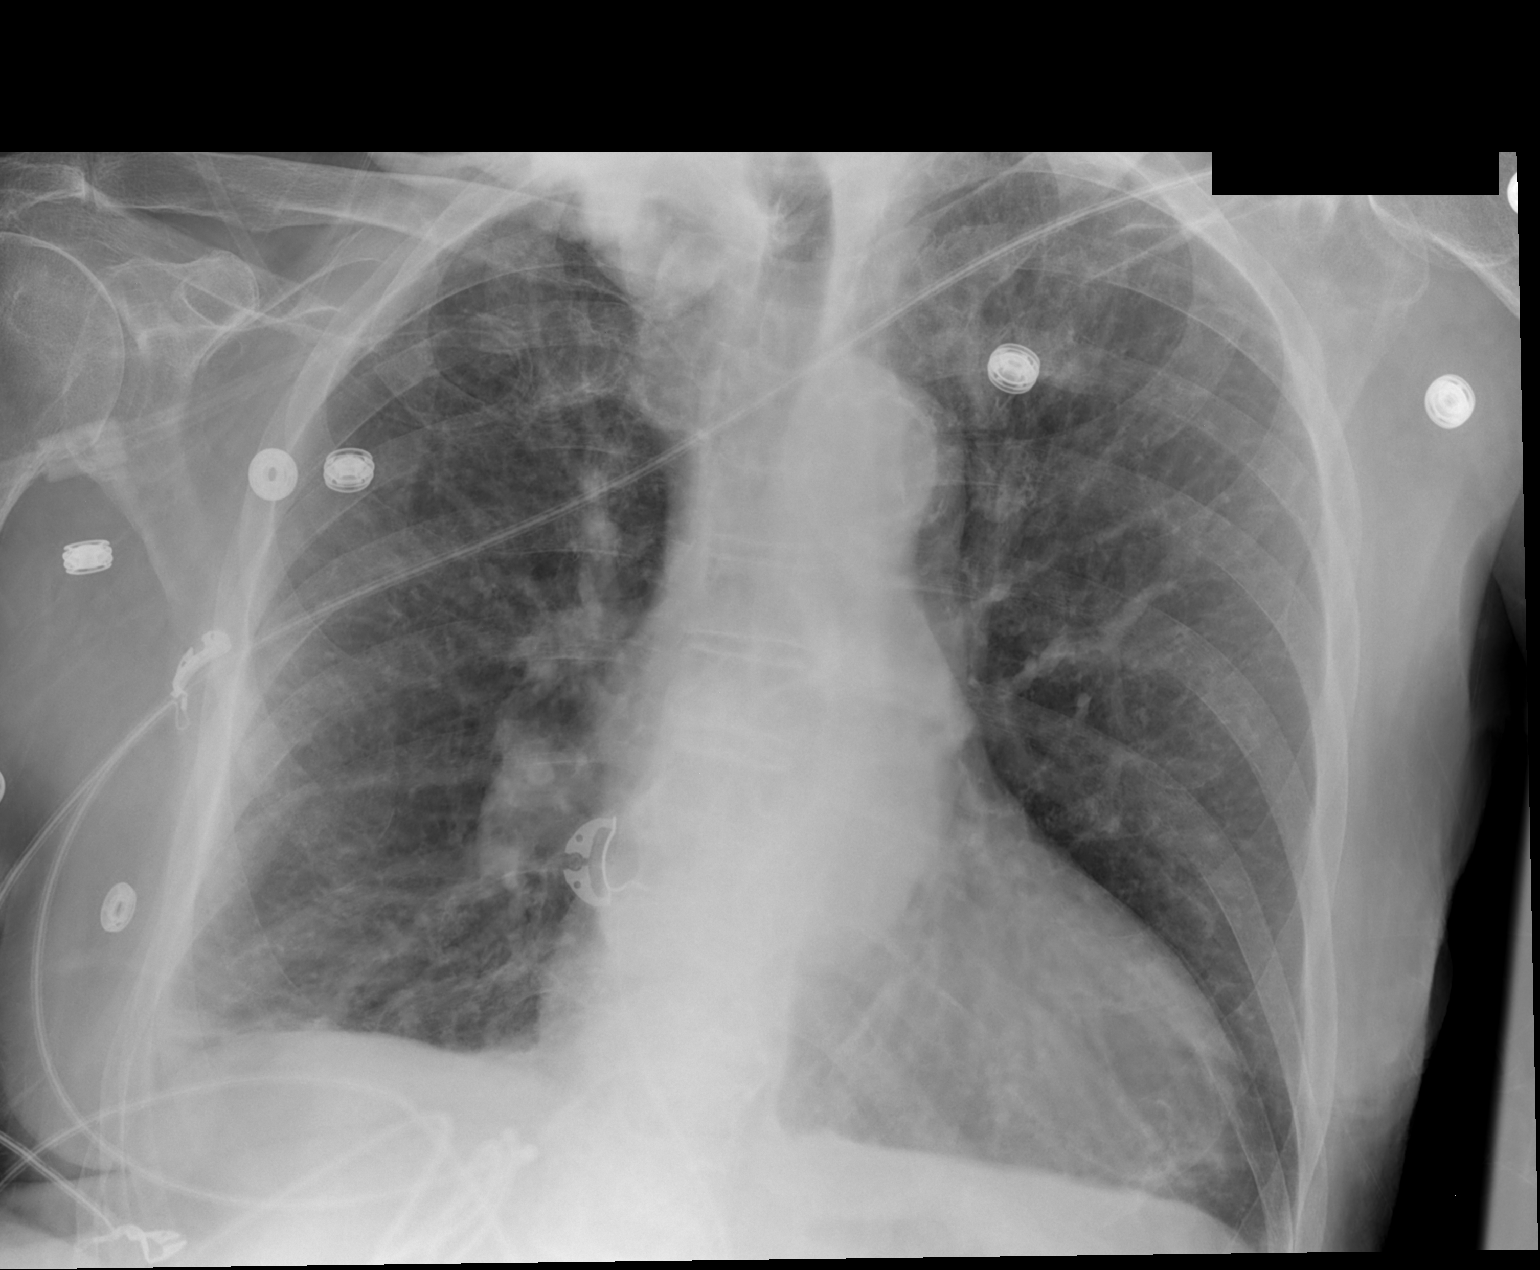

[1 of 1 positions shown; findings below may reference images not displayed]

FINDINGS: Portable AP semi upright view at 4405 hrs. Stable lung volumes.
Stable cardiac size and mediastinal contours. No pneumothorax.
Mildly increased pulmonary vascularity but no overt edema. No
definite pleural effusion. No consolidation.
IMPRESSION: Mildly increased pulmonary vascular congestion but no overt
pulmonary edema.

## 2014-09-06 ENCOUNTER — Other Ambulatory Visit: Payer: Self-pay

## 2015-05-11 DEATH — deceased

## 2015-11-05 ENCOUNTER — Encounter: Payer: Self-pay | Admitting: Internal Medicine
